# Patient Record
Sex: Female | Born: 1935 | Race: White | Hispanic: No | Smoking: Never smoker
Health system: Southern US, Community
[De-identification: ages and names within clinical notes are randomized; demographics above are authoritative.]

## PROBLEM LIST (undated history)

## (undated) DIAGNOSIS — Z952 Presence of prosthetic heart valve: Secondary | ICD-10-CM

## (undated) DIAGNOSIS — I4891 Unspecified atrial fibrillation: Secondary | ICD-10-CM

## (undated) DIAGNOSIS — M1990 Unspecified osteoarthritis, unspecified site: Secondary | ICD-10-CM

## (undated) DIAGNOSIS — I509 Heart failure, unspecified: Secondary | ICD-10-CM

## (undated) DIAGNOSIS — I00 Rheumatic fever without heart involvement: Secondary | ICD-10-CM

## (undated) DIAGNOSIS — I10 Essential (primary) hypertension: Secondary | ICD-10-CM

## (undated) DIAGNOSIS — E785 Hyperlipidemia, unspecified: Secondary | ICD-10-CM

## (undated) HISTORY — DX: Hyperlipidemia, unspecified: E78.5

## (undated) HISTORY — PX: AORTIC VALVE REPLACEMENT: SHX41

## (undated) HISTORY — DX: Heart failure, unspecified: I50.9

## (undated) HISTORY — DX: Presence of prosthetic heart valve: Z95.2

## (undated) HISTORY — PX: UPPER GI ENDOSCOPY: SHX6162

## (undated) HISTORY — DX: Unspecified atrial fibrillation: I48.91

## (undated) HISTORY — DX: Rheumatic fever without heart involvement: I00

## (undated) HISTORY — DX: Unspecified osteoarthritis, unspecified site: M19.90

## (undated) HISTORY — DX: Essential (primary) hypertension: I10

---

## 1989-10-22 ENCOUNTER — Encounter: Payer: Self-pay | Admitting: Cardiovascular Disease

## 2003-05-07 ENCOUNTER — Other Ambulatory Visit: Payer: Self-pay

## 2005-09-08 ENCOUNTER — Ambulatory Visit: Payer: Self-pay | Admitting: Family Medicine

## 2006-10-30 LAB — HM PAP SMEAR: HM PAP: NORMAL

## 2006-10-31 ENCOUNTER — Ambulatory Visit: Payer: Self-pay | Admitting: Family Medicine

## 2006-12-11 ENCOUNTER — Ambulatory Visit: Payer: Self-pay | Admitting: Family Medicine

## 2006-12-11 DIAGNOSIS — R072 Precordial pain: Secondary | ICD-10-CM | POA: Insufficient documentation

## 2006-12-12 ENCOUNTER — Encounter: Payer: Self-pay | Admitting: Cardiovascular Disease

## 2006-12-14 ENCOUNTER — Inpatient Hospital Stay: Payer: Self-pay | Admitting: Internal Medicine

## 2006-12-16 ENCOUNTER — Other Ambulatory Visit: Payer: Self-pay

## 2006-12-17 ENCOUNTER — Encounter: Payer: Self-pay | Admitting: Cardiovascular Disease

## 2007-11-19 ENCOUNTER — Ambulatory Visit: Payer: Self-pay | Admitting: Family Medicine

## 2008-01-09 ENCOUNTER — Ambulatory Visit: Payer: Self-pay | Admitting: Gastroenterology

## 2008-01-09 LAB — HM COLONOSCOPY

## 2009-01-15 ENCOUNTER — Encounter: Payer: Self-pay | Admitting: Cardiovascular Disease

## 2009-04-14 ENCOUNTER — Ambulatory Visit: Payer: Self-pay | Admitting: Family Medicine

## 2009-10-19 ENCOUNTER — Encounter: Payer: Self-pay | Admitting: Cardiovascular Disease

## 2010-01-14 ENCOUNTER — Ambulatory Visit: Payer: Self-pay | Admitting: Cardiovascular Disease

## 2010-01-14 DIAGNOSIS — E785 Hyperlipidemia, unspecified: Secondary | ICD-10-CM | POA: Insufficient documentation

## 2010-01-14 DIAGNOSIS — I4891 Unspecified atrial fibrillation: Secondary | ICD-10-CM

## 2010-01-14 DIAGNOSIS — I10 Essential (primary) hypertension: Secondary | ICD-10-CM

## 2010-01-14 DIAGNOSIS — Z9889 Other specified postprocedural states: Secondary | ICD-10-CM | POA: Insufficient documentation

## 2010-01-14 DIAGNOSIS — I2510 Atherosclerotic heart disease of native coronary artery without angina pectoris: Secondary | ICD-10-CM

## 2010-03-01 ENCOUNTER — Encounter: Payer: Self-pay | Admitting: Cardiovascular Disease

## 2010-03-01 ENCOUNTER — Ambulatory Visit: Payer: Self-pay

## 2010-03-29 ENCOUNTER — Ambulatory Visit: Payer: Self-pay | Admitting: Cardiovascular Disease

## 2010-03-30 LAB — CONVERTED CEMR LAB
BUN: 18 mg/dL (ref 6–23)
CO2: 24 meq/L (ref 19–32)
Chloride: 103 meq/L (ref 96–112)
Glucose, Bld: 113 mg/dL — ABNORMAL HIGH (ref 70–99)
Potassium: 4.7 meq/L (ref 3.5–5.3)

## 2010-04-28 ENCOUNTER — Ambulatory Visit: Payer: Self-pay | Admitting: Family Medicine

## 2010-05-25 NOTE — Letter (Signed)
Summary: [facility] - Cardiac Cath  [facility] - [facility] Cath   Imported By: Marylou Mccoy 02/15/2010 10:02:43  _____________________________________________________________________  External Attachment:    Type:   Image     Comment:   External Document

## 2010-05-25 NOTE — Assessment & Plan Note (Signed)
Summary: NP6/AMD   Visit Type:  Initial Consult Primary [name]:  Kathy Mccall, M.D.  CC:  Former [facility] patient need to establish care.  Denies chest pain or shortness of breath..  History of Present Illness: [name] is a very pleasant 77 year old woman with a history of rheumatic valve disease, St. Jude aortic valve replacement in 1991, chronic atrial fibrillation, moderate to severe tricuspid regurgitation on echocardiogram in 2008 with mild to moderate pulmonary hypertension, right ventricular systolic pressure estimated at 46, who presents to establish care.She was last seen at [facility] in August September 2010.  [name] reports that she is doing well. She has no symptoms of edema, chest discomfort. She did have an episode of lightheadedness some time ago and was seen by ear nose throat. No treatment was done other than clearing out of her wax buildup in her ears. She is active, walks with a 4 prong cane and otherwise has no complaints. Her last echo was in 2008.  EKG shows atrial fibrillation with rate of 68 beats per minute, no significant ST or T wave changes  Cardiac catheterization in August 2008 showed mild to moderate distal left main disease, moderate ostial left circumflex disease, mild ostial LAD and ostial B1 disease, severe disease of the PL range medical management was recommended.  Cholesterol in 2009 was 114, LDL 56, HDL 46 In the past, she has had difficulty tolerating Lipitor or Vytorin  Preventive Screening-Counseling & Management  Alcohol-Tobacco     Smoking Status: never  Caffeine-Diet-Exercise     Does Patient Exercise: yes  Current Medications (verified): 1)  Warfarin Sodium 3 Mg Tabs (Warfarin Sodium) .... As Directed 2)  Pravastatin Sodium 20 Mg Tabs (Pravastatin Sodium) .[name].. 1 Tab Once Daily 3)  Lisinopril 10 Mg Tabs (Lisinopril) .[name].. 1 Tab Once Daily 4)  Prilosec 20 Mg Cpdr (Omeprazole) .[name].. 1 Tab Once Daily 5)   Warfarin Sodium 2 Mg Tabs (Warfarin Sodium) .... Take As Directed 6)  Daily Multiple Vitamins  Tabs (Multiple Vitamin) .... One Tablet Once Daily 7)  Ferrex 150 150 Mg Caps (Polysaccharide Iron Complex) .... One Tablet Two Times A Day 8)  Xalatan 0.005 % Soln (Latanoprost) 9)  Hydrocodone-Acetaminophen 5-500 Mg Tabs (Hydrocodone-Acetaminophen) .... 1/2 Tablet Two Times A Day 10)  Oscal 500/200 D-3 500-200 Mg-Unit Tabs (Calcium-Vitamin D) .... One Tablet Once Daily 11)  Timoptic 0.5 % Soln (Timolol Maleate) .... Eye Drop  Allergies (verified): 1)  ! Sulfa  Past History:  Past Medical History: Arthritis Atrial Fibrillation Congestive Heart Failure St. Jude Aortic Valve replacement Hx. of rheumatic fever  Past Surgical History: St. Jude Aortic Valve replacement  Social History: Retired  Widowed  Tobacco Use - No.  Alcohol Use - no Regular Exercise - yes Smoking Status:  never Does Patient Exercise:  yes  Review of Systems  The patient denies fever, weight loss, weight gain, vision loss, decreased hearing, hoarseness, chest pain, syncope, dyspnea on exertion, peripheral edema, prolonged cough, abdominal pain, incontinence, muscle weakness, depression, and enlarged lymph nodes.    Vital Signs:  Patient profile:   77 year old female Height:      64 inches Weight:      113 pounds BMI:     19.47 Pulse rate:   66 / minute BP sitting:   162 / 82  (left arm) Cuff size:   regular  Vitals Entered By: Bishop Dublin, CMA (January 14, 2010 9:56 AM)  Physical Exam  General:  Well  developed, well nourished, in no acute distress. Head:  normocephalic and atraumatic Neck:  Neck supple, no JVD. No masses, thyromegaly or abnormal cervical nodes. Lungs:  Clear bilaterally to auscultation and percussion. Heart:  Non-displaced PMI, chest non-tender; regular rate and rhythm, S1, S2 with II/VI SEM RSB,  no  rubs or gallops. Carotid upstroke normal, no bruit. Pedals normal pulses. No  edema, no varicosities. Abdomen:   abdomen soft and non-tender without masses Msk:  Back normal, normal gait. Muscle strength and tone normal. Pulses:  pulses normal in all 4 extremities Extremities:  No clubbing or cyanosis. Neurologic:  Alert and oriented x 3. Skin:  Intact without lesions or rashes. Psych:  Normal affect.   Impression & Recommendations:  Problem # 1:  HEART VALVE REPLACEMENT, HX OF (ICD-V15.1) she has an aortic valve replacement 20 years ago with mechanical valve. She is due for an echocardiogram as the last echo was in 2008. she does not appear to have clinical indications of heart failure  Orders: EKG w/ Interpretation (93000) Echocardiogram (Echo)  Problem # 2:  CAD, NATIVE VESSEL (ICD-414.01) She does have coronary artery disease. Her cholesterol has always been at goal. No changes to her medications at this time  Her updated medication list for this problem includes:    Warfarin Sodium 3 Mg Tabs (Warfarin sodium) .[name]... As directed    Lisinopril 10 Mg Tabs (Lisinopril) .[name]... 1 tab once daily    Warfarin Sodium 2 Mg Tabs (Warfarin sodium) .[name]... Take as directed  Problem # 3:  HYPERLIPIDEMIA-MIXED (ICD-272.4) We will try to obtain her most recent lipid panel from Dr. Sullivan Lone. Cholesterol in 2009 was 114, LDL 56, HDL 46  Her updated medication list for this problem includes:    Pravastatin Sodium 20 Mg Tabs (Pravastatin sodium) .[name]... 1 tab once daily  Problem # 4:  ATRIAL FIBRILLATION (ICD-427.31) rate is well controlled on no beta blockers or calcium channel blockers.  Her updated medication list for this problem includes:    Warfarin Sodium 3 Mg Tabs (Warfarin sodium) .[name]... As directed    Warfarin Sodium 2 Mg Tabs (Warfarin sodium) .[name]... Take as directed  Problem # 5:  HYPERTENSION, BENIGN (ICD-401.1) Her blood pressure is borderline elevated today though she reports it is well controlled at home. Have asked her to monitor her blood pressure at home  and call us for systolic pressures greater than 150  Her updated medication list for this problem includes:    Lisinopril 10 Mg Tabs (Lisinopril) .[name]... 1 tab once daily  Patient Instructions: 1)  Your physician recommends that you continue on your current medications as directed. Please refer to the Current Medication list given to you today. 2)  Your physician wants you to follow-up in:   1 year You will receive a reminder letter in the mail two months in advance. If you don't receive a letter, please call our office to schedule the follow-up appointment. 3)  Your physician has requested that you have an echocardiogram.  Echocardiography is a painless test that uses sound waves to create images of your heart. It provides your doctor with information about the size and shape of your heart and how well your heart's chambers and valves are working.  This procedure takes approximately one hour. There are no restrictions for this procedure. NOVEMBER 2011

## 2010-05-25 NOTE — Letter (Signed)
Summary: External Correspondence  External Correspondence   Imported By: [place] Carbo 10/19/2009 15:14:56  _____________________________________________________________________  External Attachment:    Type:   Image     Comment:   External Document

## 2010-05-25 NOTE — Letter (Signed)
Summary: [facility] - 2-D Echo  [facility] - 2-D Echo   Imported By: Marylou Mccoy 02/15/2010 10:04:33  _____________________________________________________________________  External Attachment:    Type:   Image     Comment:   External Document

## 2010-05-25 NOTE — Letter (Signed)
Summary: [facility] - Cardiac Cath Comprehensive Report  [facility] - Cardiac Cath Comprehensive Report   Imported By: Marylou Mccoy 02/15/2010 10:09:04  _____________________________________________________________________  External Attachment:    Type:   Image     Comment:   External Document

## 2010-06-02 ENCOUNTER — Encounter: Payer: Self-pay | Admitting: Cardiovascular Disease

## 2010-10-04 ENCOUNTER — Other Ambulatory Visit: Payer: Self-pay | Admitting: Cardiovascular Disease

## 2010-11-21 ENCOUNTER — Encounter: Payer: Self-pay | Admitting: Cardiovascular Disease

## 2010-12-29 ENCOUNTER — Encounter: Payer: Self-pay | Admitting: Cardiovascular Disease

## 2011-01-02 ENCOUNTER — Ambulatory Visit (INDEPENDENT_AMBULATORY_CARE_PROVIDER_SITE_OTHER): Payer: Medicare Other | Admitting: Cardiovascular Disease

## 2011-01-02 ENCOUNTER — Encounter: Payer: Self-pay | Admitting: Cardiovascular Disease

## 2011-01-02 VITALS — BP 159/77 | HR 78 | Ht 63.0 in | Wt 108.0 lb

## 2011-01-02 DIAGNOSIS — Z9889 Other specified postprocedural states: Secondary | ICD-10-CM

## 2011-01-02 DIAGNOSIS — I2510 Atherosclerotic heart disease of native coronary artery without angina pectoris: Secondary | ICD-10-CM

## 2011-01-02 DIAGNOSIS — I359 Nonrheumatic aortic valve disorder, unspecified: Secondary | ICD-10-CM

## 2011-01-02 DIAGNOSIS — E785 Hyperlipidemia, unspecified: Secondary | ICD-10-CM

## 2011-01-02 DIAGNOSIS — I4891 Unspecified atrial fibrillation: Secondary | ICD-10-CM

## 2011-01-02 DIAGNOSIS — I10 Essential (primary) hypertension: Secondary | ICD-10-CM

## 2011-01-02 DIAGNOSIS — Z952 Presence of prosthetic heart valve: Secondary | ICD-10-CM

## 2011-01-02 NOTE — Assessment & Plan Note (Signed)
Kathy Mccall appears well controlled, she is on warfarin. No medication changes made.

## 2011-01-02 NOTE — Progress Notes (Signed)
Patient ID: Kathy Mccall, female    DOB: June 23, 1923, 77 y.o.   MRN: [id number]  HPI Comments: Kathy Mccall is a very pleasant 77 year old woman with a history of rheumatic valve disease, St. Jude aortic valve replacement in 1991, chronic atrial fibrillation, moderate to severe tricuspid regurgitation on echocardiogram in 2008 with mild to moderate pulmonary hypertension, right ventricular systolic pressure estimated at 46, who presents for routine followup.    Kathy Mccall reports that she is doing well. She has no symptoms of edema, chest discomfort. No lightheadedness. She is active, walks with a 4 prong cane and otherwise has no complaints. Her last echo was in 2008.   EKG shows atrial fibrillation with rate of 68 beats per minute, no significant ST or T wave changes   Cardiac catheterization in August 2008 showed mild to moderate distal left main disease, moderate ostial left circumflex disease, mild ostial LAD and ostial B1 disease, severe disease of the PL range medical management was recommended.   Cholesterol in 2009 was 114, LDL 56, HDL 46 In the past, she has had difficulty tolerating Lipitor, Now on low-dose pravastatin  EKG shows atrial fibrillation with rate 65 beats per minute with no significant ST or T wave changes, poor R wave progression through the anterior precordial leads      Outpatient Encounter Prescriptions as of 01/02/2011  Medication Sig Dispense Refill  . calcium-vitamin D (OSCAL 500/200 D-3) 500-200 MG-UNIT per tablet Take 1 tablet by mouth daily.        [name] DAILY MULTIPLE VITAMINS tablet Take 1 tablet by mouth daily.        . furosemide (LASIX) 20 MG tablet TAKE ONE TABLET BY MOUTH EVERY DAY  30 tablet  6  . HYDROcodone-acetaminophen (VICODIN) 5-500 MG per tablet Take 0.5 tablets by mouth 2 (two) times daily.        . iron polysaccharides (FERREX 150) 150 MG capsule Take 150 mg by mouth 2 (two) times daily.        [name] KLOR-CON M20 20 MEQ tablet TAKE ONE TABLET  BY MOUTH EVERY DAY  30 each  6  . latanoprost (XALATAN) 0.005 % ophthalmic solution 1 drop at bedtime.        [name] levothyroxine (SYNTHROID, LEVOTHROID) 50 MCG tablet Take 50 mcg by mouth daily.        [name] lisinopril (PRINIVIL,ZESTRIL) 10 MG tablet Take 10 mg by mouth daily.        [name] omeprazole (PRILOSEC) 20 MG capsule Take 20 mg by mouth daily.        . pravastatin (PRAVACHOL) 20 MG tablet Take 20 mg by mouth daily.        . timolol (TIMOPTIC) 0.5 % ophthalmic solution 1 drop.        [name] warfarin (COUMADIN) 2 MG tablet Take 2 mg by mouth as directed.        . warfarin (COUMADIN) 3 MG tablet Take 3 mg by mouth as directed.           Review of Systems  Constitutional: Negative.   HENT: Negative.   Eyes: Negative.   Respiratory: Negative.   Cardiovascular: Negative.   Gastrointestinal: Negative.   Musculoskeletal: Negative.   Skin: Negative.   Neurological: Negative.   Hematological: Negative.   Psychiatric/Behavioral: Negative.   All other systems reviewed and are negative.    BP 159/77  Pulse 78  Ht 5\' 3"  (1.6 m)  Wt 108 lb (48.988 kg)  BMI 19.13 kg/m2  Physical Exam  Nursing note and vitals reviewed. Constitutional: She is oriented to person, place, and time. She appears well-developed and well-nourished.  HENT:  Head: Normocephalic.  Nose: Nose normal.  Mouth/Throat: Oropharynx is clear and moist.  Eyes: Conjunctivae are normal. Pupils are equal, round, and reactive to light.  Neck: Normal range of motion. Neck supple. No JVD present.  Cardiovascular: Normal rate, regular rhythm, S1 normal, S2 normal and intact distal pulses.  Exam reveals no gallop and no friction rub.   Murmur heard.  Crescendo systolic murmur is present with a grade of 2/6  Pulmonary/Chest: Effort normal and breath sounds normal. No respiratory distress. She has no wheezes. She has no rales. She exhibits no tenderness.  Abdominal: Soft. Bowel sounds are normal. She exhibits no distension. There is no  tenderness.  Musculoskeletal: Normal range of motion. She exhibits no edema and no tenderness.  Lymphadenopathy:    She has no cervical adenopathy.  Neurological: She is alert and oriented to person, place, and time. Coordination normal.  Skin: Skin is warm and dry. No rash noted. No erythema.  Psychiatric: She has a normal mood and affect. Her behavior is normal. Judgment and thought content normal.         Assessment and Plan

## 2011-01-02 NOTE — Assessment & Plan Note (Signed)
Currently with no symptoms of angina. No further workup at this time. Continue current medication regimen. 

## 2011-01-02 NOTE — Assessment & Plan Note (Signed)
We will schedule her for an echocardiogram at the end of the year to evaluate her prosthetic aortic valve.  Continue low-dose diuretic. Last echo showed pulmonary hypertension.

## 2011-01-02 NOTE — Patient Instructions (Addendum)
You are doing well. No medication changes were made. Please monitro the blood pressure at home and call for systolic pressure greater than 140 Please call us if you have new issues that need to be addressed before your next appt.  We will call you for a follow up Appt. In 12 months Schedule an Echo for November, 2012.

## 2011-01-02 NOTE — Assessment & Plan Note (Signed)
I'm concerned that her cholesterol may not be at goal on pravastatin. Will wait for her followup with Dr. Sullivan Lone  later this fall and try to obtain her numbers at that time.

## 2011-01-02 NOTE — Assessment & Plan Note (Signed)
Blood pressure is elevated on today's visit. I suggested she monitor her blood pressure at home and contact us for systolic pressures greater than 140.

## 2011-03-07 ENCOUNTER — Other Ambulatory Visit (INDEPENDENT_AMBULATORY_CARE_PROVIDER_SITE_OTHER): Payer: Medicare Other | Admitting: *Deleted

## 2011-03-07 DIAGNOSIS — Z952 Presence of prosthetic heart valve: Secondary | ICD-10-CM

## 2011-03-07 DIAGNOSIS — I059 Rheumatic mitral valve disease, unspecified: Secondary | ICD-10-CM

## 2011-03-07 DIAGNOSIS — I359 Nonrheumatic aortic valve disorder, unspecified: Secondary | ICD-10-CM

## 2011-03-19 ENCOUNTER — Encounter: Payer: Self-pay | Admitting: Cardiovascular Disease

## 2011-03-20 LAB — PROTIME-INR: INR: 2 — AB (ref 0.9–1.1)

## 2011-05-04 ENCOUNTER — Telehealth: Payer: Self-pay

## 2011-05-04 MED ORDER — FUROSEMIDE 20 MG PO TABS: 20.0000 mg | ORAL_TABLET | Freq: Every day | ORAL | Status: DC

## 2011-05-04 NOTE — Telephone Encounter (Signed)
Refill sent for lasix  

## 2011-05-29 ENCOUNTER — Other Ambulatory Visit: Payer: Self-pay | Admitting: Cardiovascular Disease

## 2011-11-28 ENCOUNTER — Other Ambulatory Visit: Payer: Self-pay | Admitting: Cardiovascular Disease

## 2012-02-29 LAB — LAB REPORT - SCANNED: INR: 2.2

## 2012-03-07 ENCOUNTER — Encounter: Payer: Self-pay | Admitting: Cardiovascular Disease

## 2012-03-07 ENCOUNTER — Ambulatory Visit (INDEPENDENT_AMBULATORY_CARE_PROVIDER_SITE_OTHER): Payer: Medicare Other | Admitting: Cardiovascular Disease

## 2012-03-07 VITALS — BP 134/74 | HR 61 | Ht 63.0 in | Wt 113.5 lb

## 2012-03-07 DIAGNOSIS — Z9889 Other specified postprocedural states: Secondary | ICD-10-CM

## 2012-03-07 DIAGNOSIS — I10 Essential (primary) hypertension: Secondary | ICD-10-CM

## 2012-03-07 DIAGNOSIS — I4891 Unspecified atrial fibrillation: Secondary | ICD-10-CM

## 2012-03-07 DIAGNOSIS — E785 Hyperlipidemia, unspecified: Secondary | ICD-10-CM

## 2012-03-07 NOTE — Patient Instructions (Addendum)
You are doing well. No medication changes were made.  We will for you for an echo in 6 months, for AVR (aortic valve)  Please call us if you have new issues that need to be addressed before your next appt.  Your physician wants you to follow-up in: 12 months.  You will receive a reminder letter in the mail two months in advance. If you don't receive a letter, please call our office to schedule the follow-up appointment.

## 2012-03-07 NOTE — Addendum Note (Signed)
Addended by: Sabino Snipes E on: 03/07/2012 03:09 PM   Modules accepted: Orders

## 2012-03-07 NOTE — Assessment & Plan Note (Signed)
Rate is well controlled. No changes to her medications.   

## 2012-03-07 NOTE — Progress Notes (Signed)
Patient ID: Kathy Mccall, female    DOB: 1923-09-02, 78 y.o.   MRN: [id number]  HPI Comments: Kathy Mccall is a very pleasant 78 year old woman with a history of rheumatic valve disease, St. Jude aortic valve replacement in 1991, chronic atrial fibrillation on warfarin, moderate to severe tricuspid regurgitation on echocardiogram in 2008 with mild to moderate pulmonary hypertension, right ventricular systolic pressure estimated at 46, who presents for routine followup.    Kathy Mccall reports that she is doing well. She has no symptoms of edema, chest discomfort. No lightheadedness. She is active, walks with a 4 prong cane and otherwise has no complaints. Her last echo was in 2008. She has had a several pound weight gain since her last clinic visit. Some gait instability. No falls.   Cardiac catheterization in August 2008 showed mild to moderate distal left main disease, moderate ostial left circumflex disease, mild ostial LAD and ostial B1 disease, severe disease of the PL range medical management was recommended.   In the past, she has had difficulty tolerating Lipitor, Now on low-dose pravastatin  EKG shows atrial fibrillation with rate of 61 beats per minute, no significant ST or T wave changes        Outpatient Encounter Prescriptions as of 03/07/2012  Medication Sig Dispense Refill  . calcium-vitamin D (OSCAL 500/200 D-3) 500-200 MG-UNIT per tablet Take 1 tablet by mouth daily.        [name] DAILY MULTIPLE VITAMINS tablet Take 1 tablet by mouth daily.        . furosemide (LASIX) 20 MG tablet TAKE ONE TABLET BY MOUTH EVERY DAY  30 tablet  5  . HYDROcodone-acetaminophen (VICODIN) 5-500 MG per tablet Take 0.5 tablets by mouth 2 (two) times daily.        . iron polysaccharides (FERREX 150) 150 MG capsule Take 150 mg by mouth 2 (two) times daily.        [name] KLOR-CON M20 20 MEQ tablet TAKE ONE TABLET BY MOUTH EVERY DAY  30 each  11  . latanoprost (XALATAN) 0.005 % ophthalmic solution 1 drop at  bedtime.        [name] levothyroxine (SYNTHROID, LEVOTHROID) 50 MCG tablet Take 50 mcg by mouth daily.        [name] lisinopril (PRINIVIL,ZESTRIL) 10 MG tablet Take 10 mg by mouth daily.        [name] omeprazole (PRILOSEC) 20 MG capsule Take 20 mg by mouth daily.        . pravastatin (PRAVACHOL) 20 MG tablet Take 20 mg by mouth daily.        . timolol (TIMOPTIC) 0.5 % ophthalmic solution 1 drop.        [name] warfarin (COUMADIN) 2 MG tablet Take 2 mg by mouth as directed.        . warfarin (COUMADIN) 3 MG tablet Take 3 mg by mouth as directed.           Review of Systems  Constitutional: Negative.   HENT: Negative.   Eyes: Negative.   Respiratory: Negative.   Cardiovascular: Negative.   Gastrointestinal: Negative.   Musculoskeletal: Positive for gait problem.  Skin: Negative.   Neurological: Negative.   Hematological: Negative.   Psychiatric/Behavioral: Negative.   All other systems reviewed and are negative.    BP 134/74  Pulse 61  Ht 5\' 3"  (1.6 m)  Wt 113 lb 8 oz (51.483 kg)  BMI 20.11 kg/m2  Physical Exam  Nursing note and vitals reviewed. Constitutional: She is  oriented to person, place, and time. She appears well-developed and well-nourished.  HENT:  Head: Normocephalic.  Nose: Nose normal.  Mouth/Throat: Oropharynx is clear and moist.  Eyes: Conjunctivae normal are normal. Pupils are equal, round, and reactive to light.  Neck: Normal range of motion. Neck supple. No JVD present.  Cardiovascular: Normal rate, S1 normal, S2 normal and intact distal pulses.  An irregularly irregular rhythm present. Exam reveals no gallop and no friction rub.   Murmur heard.  Crescendo systolic murmur is present with a grade of 2/6  Pulmonary/Chest: Effort normal and breath sounds normal. No respiratory distress. She has no wheezes. She has no rales. She exhibits no tenderness.  Abdominal: Soft. Bowel sounds are normal. She exhibits no distension. There is no tenderness.  Musculoskeletal: Normal range of  motion. She exhibits no edema and no tenderness.  Lymphadenopathy:    She has no cervical adenopathy.  Neurological: She is alert and oriented to person, place, and time. Coordination normal.  Skin: Skin is warm and dry. No rash noted. No erythema.  Psychiatric: She has a normal mood and affect. Her behavior is normal. Judgment and thought content normal.         Assessment and Plan

## 2012-03-07 NOTE — Assessment & Plan Note (Signed)
Blood pressure is well controlled on today's visit. No changes made to the medications. 

## 2012-03-07 NOTE — Assessment & Plan Note (Signed)
We will recheck her valve by echocardiogram. She prefers to wait several months. We'll do this in 6 months time.

## 2012-03-07 NOTE — Assessment & Plan Note (Signed)
No changes to the medications were made. She has lab work done by Dr. Sullivan Lone.

## 2012-04-15 ENCOUNTER — Telehealth: Payer: Self-pay | Admitting: Cardiovascular Disease

## 2012-04-15 NOTE — Telephone Encounter (Signed)
Patient had to come off coumadin due to nose bleed has some questions regarding the dosing primary care office told her to take.  Please Call jane spoon patient's daughter to discuss.

## 2012-04-15 NOTE — Telephone Encounter (Signed)
Returned call to pt's daughter, Dr Sullivan Lone primary MD monitors pt's Coumadin. He instructed pt to hold Coumadin x 3 days secondary to nosebleed, INR 1.3 gave 2mg  x 3 days INR 1.0.  Previous dosage was 3mg  daily INR was around 3.0 at time of nosebleed.  Nose packing removed by ENT advised 7-10 days before completely healed.  Dr Sullivan Lone advised pt if nose well, take 6mg  x 3 days, if not well take 2mg  x 3 days then 3mg  daily, pt's daughter unsure what to do unhappy with Dr Elisabeth Cara instructions and wishes to est pt in Coumadin clinic for monitoring.  Advised to have pt resume 3mg  daily today and made new Coumadin appt in clinic for next week 04/26/11.  Advised to continue to monitor for bleeding and call with questions or problems.  Pt's daughter verbalized understanding.

## 2012-04-23 LAB — LAB REPORT - SCANNED: INR: 1.5

## 2012-04-25 ENCOUNTER — Ambulatory Visit (INDEPENDENT_AMBULATORY_CARE_PROVIDER_SITE_OTHER): Payer: Medicare Other

## 2012-04-25 DIAGNOSIS — Z7901 Long term (current) use of anticoagulants: Secondary | ICD-10-CM | POA: Insufficient documentation

## 2012-04-25 DIAGNOSIS — I4891 Unspecified atrial fibrillation: Secondary | ICD-10-CM

## 2012-04-25 DIAGNOSIS — Z9889 Other specified postprocedural states: Secondary | ICD-10-CM

## 2012-04-25 LAB — POCT INR: INR: 2.1

## 2012-05-01 ENCOUNTER — Ambulatory Visit (INDEPENDENT_AMBULATORY_CARE_PROVIDER_SITE_OTHER): Payer: Medicare Other

## 2012-05-01 DIAGNOSIS — I4891 Unspecified atrial fibrillation: Secondary | ICD-10-CM

## 2012-05-01 DIAGNOSIS — Z9889 Other specified postprocedural states: Secondary | ICD-10-CM

## 2012-05-01 DIAGNOSIS — Z7901 Long term (current) use of anticoagulants: Secondary | ICD-10-CM

## 2012-05-01 LAB — POCT INR: INR: 2.4

## 2012-05-28 ENCOUNTER — Other Ambulatory Visit: Payer: Self-pay | Admitting: Cardiovascular Disease

## 2012-05-28 NOTE — Telephone Encounter (Signed)
Refilled Furosemide sent to [facility].

## 2012-05-29 ENCOUNTER — Ambulatory Visit (INDEPENDENT_AMBULATORY_CARE_PROVIDER_SITE_OTHER): Payer: Medicare Other

## 2012-05-29 DIAGNOSIS — Z7901 Long term (current) use of anticoagulants: Secondary | ICD-10-CM

## 2012-05-29 DIAGNOSIS — Z9889 Other specified postprocedural states: Secondary | ICD-10-CM

## 2012-05-29 DIAGNOSIS — I4891 Unspecified atrial fibrillation: Secondary | ICD-10-CM

## 2012-05-29 LAB — POCT INR: INR: 4.3

## 2012-06-12 ENCOUNTER — Ambulatory Visit (INDEPENDENT_AMBULATORY_CARE_PROVIDER_SITE_OTHER): Payer: Medicare Other

## 2012-06-12 DIAGNOSIS — Z9889 Other specified postprocedural states: Secondary | ICD-10-CM

## 2012-06-12 LAB — POCT INR: INR: 2.8

## 2012-07-03 ENCOUNTER — Ambulatory Visit (INDEPENDENT_AMBULATORY_CARE_PROVIDER_SITE_OTHER): Payer: Medicare Other

## 2012-07-03 DIAGNOSIS — Z9889 Other specified postprocedural states: Secondary | ICD-10-CM

## 2012-07-03 LAB — POCT INR: INR: 2.9

## 2012-07-04 ENCOUNTER — Other Ambulatory Visit: Payer: Self-pay | Admitting: Cardiovascular Disease

## 2012-07-04 NOTE — Telephone Encounter (Signed)
Refilled Klor Con sent to [facility] pharmacy.

## 2012-07-31 ENCOUNTER — Ambulatory Visit (INDEPENDENT_AMBULATORY_CARE_PROVIDER_SITE_OTHER): Payer: Medicare Other

## 2012-07-31 DIAGNOSIS — Z9889 Other specified postprocedural states: Secondary | ICD-10-CM

## 2012-07-31 DIAGNOSIS — I4891 Unspecified atrial fibrillation: Secondary | ICD-10-CM

## 2012-07-31 DIAGNOSIS — Z7901 Long term (current) use of anticoagulants: Secondary | ICD-10-CM

## 2012-08-28 ENCOUNTER — Ambulatory Visit: Payer: Self-pay | Admitting: Family Medicine

## 2012-09-03 ENCOUNTER — Other Ambulatory Visit: Payer: Self-pay

## 2012-09-03 ENCOUNTER — Other Ambulatory Visit (INDEPENDENT_AMBULATORY_CARE_PROVIDER_SITE_OTHER): Payer: Medicare Other

## 2012-09-03 DIAGNOSIS — I4891 Unspecified atrial fibrillation: Secondary | ICD-10-CM

## 2012-09-03 DIAGNOSIS — I359 Nonrheumatic aortic valve disorder, unspecified: Secondary | ICD-10-CM

## 2012-09-03 DIAGNOSIS — Z9889 Other specified postprocedural states: Secondary | ICD-10-CM

## 2012-09-03 DIAGNOSIS — I10 Essential (primary) hypertension: Secondary | ICD-10-CM

## 2012-09-03 DIAGNOSIS — E785 Hyperlipidemia, unspecified: Secondary | ICD-10-CM

## 2012-09-10 ENCOUNTER — Ambulatory Visit: Payer: Self-pay | Admitting: Family Medicine

## 2012-09-11 ENCOUNTER — Ambulatory Visit (INDEPENDENT_AMBULATORY_CARE_PROVIDER_SITE_OTHER): Payer: Medicare Other

## 2012-09-11 DIAGNOSIS — I4891 Unspecified atrial fibrillation: Secondary | ICD-10-CM

## 2012-09-11 DIAGNOSIS — Z7901 Long term (current) use of anticoagulants: Secondary | ICD-10-CM

## 2012-09-11 DIAGNOSIS — Z9889 Other specified postprocedural states: Secondary | ICD-10-CM

## 2012-10-23 ENCOUNTER — Ambulatory Visit (INDEPENDENT_AMBULATORY_CARE_PROVIDER_SITE_OTHER): Payer: Medicare Other | Admitting: *Deleted

## 2012-10-23 DIAGNOSIS — Z7901 Long term (current) use of anticoagulants: Secondary | ICD-10-CM

## 2012-10-23 DIAGNOSIS — I4891 Unspecified atrial fibrillation: Secondary | ICD-10-CM

## 2012-10-23 DIAGNOSIS — Z9889 Other specified postprocedural states: Secondary | ICD-10-CM

## 2012-10-23 LAB — POCT INR: INR: 2.9

## 2012-10-24 ENCOUNTER — Other Ambulatory Visit: Payer: Self-pay | Admitting: Cardiovascular Disease

## 2012-11-25 ENCOUNTER — Other Ambulatory Visit: Payer: Self-pay | Admitting: Cardiovascular Disease

## 2012-11-25 NOTE — Telephone Encounter (Signed)
Refilled Klor-Con 

## 2012-12-04 ENCOUNTER — Ambulatory Visit (INDEPENDENT_AMBULATORY_CARE_PROVIDER_SITE_OTHER): Payer: Medicare Other

## 2012-12-04 DIAGNOSIS — Z7901 Long term (current) use of anticoagulants: Secondary | ICD-10-CM

## 2012-12-04 DIAGNOSIS — I4891 Unspecified atrial fibrillation: Secondary | ICD-10-CM

## 2012-12-04 DIAGNOSIS — Z9889 Other specified postprocedural states: Secondary | ICD-10-CM

## 2013-01-15 ENCOUNTER — Ambulatory Visit (INDEPENDENT_AMBULATORY_CARE_PROVIDER_SITE_OTHER): Payer: Medicare Other

## 2013-01-15 DIAGNOSIS — I4891 Unspecified atrial fibrillation: Secondary | ICD-10-CM

## 2013-01-15 DIAGNOSIS — Z9889 Other specified postprocedural states: Secondary | ICD-10-CM

## 2013-01-15 DIAGNOSIS — Z7901 Long term (current) use of anticoagulants: Secondary | ICD-10-CM

## 2013-02-12 ENCOUNTER — Ambulatory Visit (INDEPENDENT_AMBULATORY_CARE_PROVIDER_SITE_OTHER): Payer: Medicare Other | Admitting: General Practice

## 2013-02-12 DIAGNOSIS — Z7901 Long term (current) use of anticoagulants: Secondary | ICD-10-CM

## 2013-02-12 DIAGNOSIS — I4891 Unspecified atrial fibrillation: Secondary | ICD-10-CM

## 2013-02-12 DIAGNOSIS — Z9889 Other specified postprocedural states: Secondary | ICD-10-CM

## 2013-02-12 LAB — POCT INR: INR: 2.5

## 2013-03-07 ENCOUNTER — Ambulatory Visit (INDEPENDENT_AMBULATORY_CARE_PROVIDER_SITE_OTHER): Payer: Medicare Other | Admitting: *Deleted

## 2013-03-07 ENCOUNTER — Encounter: Payer: Medicare Other | Admitting: *Deleted

## 2013-03-07 ENCOUNTER — Ambulatory Visit (INDEPENDENT_AMBULATORY_CARE_PROVIDER_SITE_OTHER): Payer: Medicare Other | Admitting: Cardiovascular Disease

## 2013-03-07 ENCOUNTER — Encounter: Payer: Self-pay | Admitting: Cardiovascular Disease

## 2013-03-07 VITALS — BP 142/84 | HR 61 | Ht 63.0 in | Wt 114.0 lb

## 2013-03-07 DIAGNOSIS — Z7901 Long term (current) use of anticoagulants: Secondary | ICD-10-CM

## 2013-03-07 DIAGNOSIS — Z9889 Other specified postprocedural states: Secondary | ICD-10-CM

## 2013-03-07 DIAGNOSIS — I4891 Unspecified atrial fibrillation: Secondary | ICD-10-CM

## 2013-03-07 DIAGNOSIS — E785 Hyperlipidemia, unspecified: Secondary | ICD-10-CM

## 2013-03-07 DIAGNOSIS — I10 Essential (primary) hypertension: Secondary | ICD-10-CM

## 2013-03-07 DIAGNOSIS — I2510 Atherosclerotic heart disease of native coronary artery without angina pectoris: Secondary | ICD-10-CM

## 2013-03-07 NOTE — Assessment & Plan Note (Signed)
Blood pressure is well controlled on today's visit. No changes made to the medications. 

## 2013-03-07 NOTE — Assessment & Plan Note (Signed)
Recent echocardiogram early 2014 shows valve is well seated. No significant AS or AI

## 2013-03-07 NOTE — Assessment & Plan Note (Signed)
Rate is well controlled. No changes to her medications.

## 2013-03-07 NOTE — Assessment & Plan Note (Signed)
Cholesterol is at goal on the current lipid regimen. No changes to the medications were made.  

## 2013-03-07 NOTE — Patient Instructions (Signed)
You are doing well. No medication changes were made.  Please call us if you have new issues that need to be addressed before your next appt.  Your physician wants you to follow-up in: 12 months.  You will receive a reminder letter in the mail two months in advance. If you don't receive a letter, please call our office to schedule the follow-up appointment. 

## 2013-03-07 NOTE — Assessment & Plan Note (Signed)
Currently with no symptoms of angina. No further workup at this time. Continue current medication regimen. 

## 2013-03-07 NOTE — Progress Notes (Signed)
Patient ID: Kathy Mccall, female    DOB: [birth year], 79 y.o.   MRN: [id number]  HPI Comments: Kathy Mccall is a very pleasant 79 year old woman with a history of rheumatic valve disease, St. Jude aortic valve replacement in 1991, chronic atrial fibrillation on warfarin,  severe tricuspid regurgitation on echocardiogram dating back to 2008, previous history of mild to moderate pulmonary hypertension (right ventricular systolic pressure estimated at 46), who presents for routine followup. She has been stable on Lasix daily.   Kathy Mccall reports that she is doing well. She has no symptoms of edema, chest discomfort. No lightheadedness. She is active, walks with a 4 prong cane and otherwise has no complaints. Her last echo was in early 2014 . Normal ejection fraction, severe TR, well-seated aortic valve . Poor appetite.  Some gait instability. No falls. No complications on warfarin   Cardiac catheterization in August 2008 showed mild to moderate distal left main disease, moderate ostial left circumflex disease, mild ostial LAD and ostial B1 disease, severe disease of the PL range medical management was recommended.   In the past, she has had difficulty tolerating Lipitor, Now on low-dose pravastatin Total cholesterol 166, LDL 85, HDL 65  EKG shows atrial fibrillation with rate of 61 beats per minute, no significant ST or T wave changes        Outpatient Encounter Prescriptions as of 03/07/2013  Medication Sig  . calcium-vitamin D (OSCAL 500/200 D-3) 500-200 MG-UNIT per tablet Take 1 tablet by mouth daily.    [name] DAILY MULTIPLE VITAMINS tablet Take 1 tablet by mouth daily.    . furosemide (LASIX) 20 MG tablet TAKE ONE TABLET BY MOUTH EVERY DAY  . HYDROcodone-acetaminophen (VICODIN) 5-500 MG per tablet Take 0.5 tablets by mouth 2 (two) times daily.    . iron polysaccharides (FERREX 150) 150 MG capsule Take 150 mg by mouth 2 (two) times daily.    [name] KLOR-CON M20 20 MEQ tablet TAKE ONE TABLET  BY MOUTH ONCE DAILY  . latanoprost (XALATAN) 0.005 % ophthalmic solution 1 drop at bedtime.    [name] levothyroxine (SYNTHROID, LEVOTHROID) 50 MCG tablet Take 50 mcg by mouth daily.    [name] lisinopril (PRINIVIL,ZESTRIL) 10 MG tablet Take 10 mg by mouth daily.    [name] omeprazole (PRILOSEC) 20 MG capsule Take 20 mg by mouth daily.    . pravastatin (PRAVACHOL) 20 MG tablet Take 20 mg by mouth daily.    . timolol (TIMOPTIC) 0.5 % ophthalmic solution 1 drop.    [name] warfarin (COUMADIN) 3 MG tablet Take 3 mg by mouth as directed.    . [DISCONTINUED] warfarin (COUMADIN) 2 MG tablet Take 2 mg by mouth as directed.       Review of Systems  Constitutional: Negative.   HENT: Negative.   Eyes: Negative.   Respiratory: Negative.   Cardiovascular: Negative.   Gastrointestinal: Negative.   Endocrine: Negative.   Musculoskeletal: Positive for gait problem.  Skin: Negative.   Allergic/Immunologic: Negative.   Neurological: Negative.   Hematological: Negative.   Psychiatric/Behavioral: Negative.   All other systems reviewed and are negative.    BP 142/84  Pulse 61  Ht 5\' 3"  (1.6 m)  Wt 114 lb (51.71 kg)  BMI 20.20 kg/m2  Physical Exam  Nursing note and vitals reviewed. Constitutional: She is oriented to person, place, and time. She appears well-developed and well-nourished.  HENT:  Head: Normocephalic.  Nose: Nose normal.  Mouth/Throat: Oropharynx is clear and moist.  Eyes: Conjunctivae are normal.  Pupils are equal, round, and reactive to light.  Neck: Normal range of motion. Neck supple. No JVD present.  Cardiovascular: Normal rate, S1 normal, S2 normal and intact distal pulses.  An irregularly irregular rhythm present. Exam reveals no gallop and no friction rub.   Murmur heard.  Crescendo systolic murmur is present with a grade of 2/6  Pulmonary/Chest: Effort normal and breath sounds normal. No respiratory distress. She has no wheezes. She has no rales. She exhibits no tenderness.  Abdominal:  Soft. Bowel sounds are normal. She exhibits no distension. There is no tenderness.  Musculoskeletal: Normal range of motion. She exhibits no edema and no tenderness.  Lymphadenopathy:    She has no cervical adenopathy.  Neurological: She is alert and oriented to person, place, and time. Coordination normal.  Skin: Skin is warm and dry. No rash noted. No erythema.  Psychiatric: She has a normal mood and affect. Her behavior is normal. Judgment and thought content normal.    Assessment and Plan       And a okay if I may

## 2013-03-17 ENCOUNTER — Encounter: Payer: Self-pay | Admitting: *Deleted

## 2013-04-02 ENCOUNTER — Ambulatory Visit (INDEPENDENT_AMBULATORY_CARE_PROVIDER_SITE_OTHER): Payer: Medicare Other | Admitting: General Practice

## 2013-04-02 DIAGNOSIS — Z9889 Other specified postprocedural states: Secondary | ICD-10-CM

## 2013-04-02 DIAGNOSIS — I4891 Unspecified atrial fibrillation: Secondary | ICD-10-CM

## 2013-04-02 DIAGNOSIS — Z7901 Long term (current) use of anticoagulants: Secondary | ICD-10-CM

## 2013-04-02 LAB — POCT INR: INR: 2.8

## 2013-04-03 ENCOUNTER — Other Ambulatory Visit: Payer: Self-pay | Admitting: Cardiovascular Disease

## 2013-04-03 ENCOUNTER — Other Ambulatory Visit: Payer: Self-pay | Admitting: *Deleted

## 2013-04-03 MED ORDER — POTASSIUM CHLORIDE CRYS ER 20 MEQ PO TBCR: EXTENDED_RELEASE_TABLET | ORAL | Status: DC

## 2013-04-03 NOTE — Telephone Encounter (Signed)
Requested Prescriptions   Signed Prescriptions Disp Refills  . potassium chloride SA (KLOR-CON M20) 20 MEQ tablet 30 tablet 3    Sig: TAKE ONE TABLET BY MOUTH ONCE DAILY    Authorizing Provider: Antonieta Iba    Ordering User: Kendrick Fries

## 2013-05-14 ENCOUNTER — Ambulatory Visit (INDEPENDENT_AMBULATORY_CARE_PROVIDER_SITE_OTHER): Payer: Medicare Other

## 2013-05-14 DIAGNOSIS — I4891 Unspecified atrial fibrillation: Secondary | ICD-10-CM

## 2013-05-14 DIAGNOSIS — Z7901 Long term (current) use of anticoagulants: Secondary | ICD-10-CM

## 2013-05-14 DIAGNOSIS — Z9889 Other specified postprocedural states: Secondary | ICD-10-CM

## 2013-05-14 LAB — POCT INR: INR: 3.1

## 2013-05-29 ENCOUNTER — Other Ambulatory Visit: Payer: Self-pay | Admitting: Cardiovascular Disease

## 2013-06-18 ENCOUNTER — Ambulatory Visit (INDEPENDENT_AMBULATORY_CARE_PROVIDER_SITE_OTHER): Payer: Medicare Other

## 2013-06-18 DIAGNOSIS — I4891 Unspecified atrial fibrillation: Secondary | ICD-10-CM

## 2013-06-18 DIAGNOSIS — Z9889 Other specified postprocedural states: Secondary | ICD-10-CM

## 2013-06-18 DIAGNOSIS — Z7901 Long term (current) use of anticoagulants: Secondary | ICD-10-CM

## 2013-06-18 LAB — POCT INR: INR: 2.6

## 2013-06-30 ENCOUNTER — Other Ambulatory Visit: Payer: Self-pay | Admitting: Cardiovascular Disease

## 2013-07-30 ENCOUNTER — Ambulatory Visit (INDEPENDENT_AMBULATORY_CARE_PROVIDER_SITE_OTHER): Payer: Medicare Other

## 2013-07-30 ENCOUNTER — Other Ambulatory Visit: Payer: Self-pay | Admitting: Cardiovascular Disease

## 2013-07-30 DIAGNOSIS — I4891 Unspecified atrial fibrillation: Secondary | ICD-10-CM

## 2013-07-30 DIAGNOSIS — Z7901 Long term (current) use of anticoagulants: Secondary | ICD-10-CM

## 2013-07-30 DIAGNOSIS — Z9889 Other specified postprocedural states: Secondary | ICD-10-CM

## 2013-07-30 LAB — POCT INR: INR: 2.4

## 2013-07-31 ENCOUNTER — Other Ambulatory Visit: Payer: Self-pay | Admitting: Cardiovascular Disease

## 2013-09-10 ENCOUNTER — Ambulatory Visit (INDEPENDENT_AMBULATORY_CARE_PROVIDER_SITE_OTHER): Payer: Medicare Other

## 2013-09-10 DIAGNOSIS — Z9889 Other specified postprocedural states: Secondary | ICD-10-CM

## 2013-09-10 DIAGNOSIS — I4891 Unspecified atrial fibrillation: Secondary | ICD-10-CM

## 2013-09-10 DIAGNOSIS — Z7901 Long term (current) use of anticoagulants: Secondary | ICD-10-CM

## 2013-09-10 LAB — POCT INR: INR: 2.6

## 2013-10-29 ENCOUNTER — Ambulatory Visit (INDEPENDENT_AMBULATORY_CARE_PROVIDER_SITE_OTHER): Payer: Medicare Other

## 2013-10-29 DIAGNOSIS — Z7901 Long term (current) use of anticoagulants: Secondary | ICD-10-CM

## 2013-10-29 DIAGNOSIS — Z9889 Other specified postprocedural states: Secondary | ICD-10-CM

## 2013-10-29 DIAGNOSIS — I4891 Unspecified atrial fibrillation: Secondary | ICD-10-CM

## 2013-10-29 LAB — POCT INR: INR: 2.6

## 2013-11-26 ENCOUNTER — Other Ambulatory Visit: Payer: Self-pay | Admitting: Cardiovascular Disease

## 2013-12-10 ENCOUNTER — Ambulatory Visit (INDEPENDENT_AMBULATORY_CARE_PROVIDER_SITE_OTHER): Payer: Medicare Other

## 2013-12-10 DIAGNOSIS — I4891 Unspecified atrial fibrillation: Secondary | ICD-10-CM

## 2013-12-10 DIAGNOSIS — Z9889 Other specified postprocedural states: Secondary | ICD-10-CM

## 2013-12-10 DIAGNOSIS — Z7901 Long term (current) use of anticoagulants: Secondary | ICD-10-CM

## 2013-12-10 LAB — POCT INR: INR: 3.2

## 2013-12-23 LAB — TSH: TSH: 1.23 u[IU]/mL (ref <=5.90)

## 2014-01-07 ENCOUNTER — Ambulatory Visit (INDEPENDENT_AMBULATORY_CARE_PROVIDER_SITE_OTHER): Payer: Medicare Other

## 2014-01-07 DIAGNOSIS — I4891 Unspecified atrial fibrillation: Secondary | ICD-10-CM

## 2014-01-07 DIAGNOSIS — Z7901 Long term (current) use of anticoagulants: Secondary | ICD-10-CM

## 2014-01-07 DIAGNOSIS — Z9889 Other specified postprocedural states: Secondary | ICD-10-CM

## 2014-01-07 LAB — POCT INR: INR: 3.7

## 2014-01-28 ENCOUNTER — Ambulatory Visit (INDEPENDENT_AMBULATORY_CARE_PROVIDER_SITE_OTHER): Payer: Medicare Other

## 2014-01-28 DIAGNOSIS — Z7901 Long term (current) use of anticoagulants: Secondary | ICD-10-CM

## 2014-01-28 DIAGNOSIS — Z9889 Other specified postprocedural states: Secondary | ICD-10-CM

## 2014-01-28 DIAGNOSIS — I4891 Unspecified atrial fibrillation: Secondary | ICD-10-CM

## 2014-01-28 LAB — POCT INR: INR: 2.5

## 2014-02-03 ENCOUNTER — Ambulatory Visit: Payer: Self-pay | Admitting: Family Medicine

## 2014-02-25 ENCOUNTER — Ambulatory Visit (INDEPENDENT_AMBULATORY_CARE_PROVIDER_SITE_OTHER): Payer: Medicare Other

## 2014-02-25 DIAGNOSIS — Z7901 Long term (current) use of anticoagulants: Secondary | ICD-10-CM

## 2014-02-25 DIAGNOSIS — Z9889 Other specified postprocedural states: Secondary | ICD-10-CM

## 2014-02-25 DIAGNOSIS — I4891 Unspecified atrial fibrillation: Secondary | ICD-10-CM

## 2014-02-25 LAB — POCT INR: INR: 2.4

## 2014-03-12 ENCOUNTER — Ambulatory Visit (INDEPENDENT_AMBULATORY_CARE_PROVIDER_SITE_OTHER): Payer: Medicare Other | Admitting: Cardiovascular Disease

## 2014-03-12 ENCOUNTER — Encounter: Payer: Self-pay | Admitting: Cardiovascular Disease

## 2014-03-12 VITALS — BP 164/78 | HR 62 | Ht 62.0 in | Wt 108.8 lb

## 2014-03-12 DIAGNOSIS — E785 Hyperlipidemia, unspecified: Secondary | ICD-10-CM

## 2014-03-12 DIAGNOSIS — Z7901 Long term (current) use of anticoagulants: Secondary | ICD-10-CM

## 2014-03-12 DIAGNOSIS — Z9889 Other specified postprocedural states: Secondary | ICD-10-CM

## 2014-03-12 DIAGNOSIS — I2510 Atherosclerotic heart disease of native coronary artery without angina pectoris: Secondary | ICD-10-CM

## 2014-03-12 DIAGNOSIS — I4891 Unspecified atrial fibrillation: Secondary | ICD-10-CM

## 2014-03-12 DIAGNOSIS — I10 Essential (primary) hypertension: Secondary | ICD-10-CM

## 2014-03-12 NOTE — Progress Notes (Signed)
Patient ID: Kathy Mccall, female    DOB: 28-Jan-1924, 80 y.o.   MRN: [id number]  HPI Comments: Kathy Mccall is a very pleasant 80 year old woman with a history of rheumatic valve disease, St. Jude aortic valve replacement in 1991, chronic atrial fibrillation on warfarin,  severe tricuspid regurgitation on echocardiogram dating back to 2008, previous history of mild to moderate pulmonary hypertension (right ventricular systolic pressure estimated at 46), who presents for routine followup of her atrial fibrillation and prosthetic valve  . She has been stable on Lasix daily.  In follow-up today, she reports that she is doing well, occasionally has swelling in her ankles but this typically resolves without intervention. No significant shortness of breath with exertion. Legs are getting weaker, she tries to stay active. Sometimes  walks with a 4 prong cane and otherwise has no complaints.    echo was in early 2014 . Normal ejection fraction, severe TR, well-seated aortic valve .  Some gait instability. No falls. No complications on warfarin  EKG today shows atrial fibrillation with ventricular rate 68 bpm, no significant ST or T wave changes   Other past medical history  Cardiac catheterization in August 2008 showed mild to moderate distal left main disease, moderate ostial left circumflex disease, mild ostial LAD and ostial B1 disease, severe disease of the PL range medical management was recommended.   In the past, she has had difficulty tolerating Lipitor, Now on low-dose pravastatin Total cholesterol 166, LDL 85, HDL 65      Outpatient Encounter Prescriptions as of 03/12/2014  Medication Sig  . calcium-vitamin D (OSCAL 500/200 D-3) 500-200 MG-UNIT per tablet Take 1 tablet by mouth daily.    [name] DAILY MULTIPLE VITAMINS tablet Take 1 tablet by mouth daily.    . furosemide (LASIX) 20 MG tablet TAKE ONE TABLET BY MOUTH ONCE DAILY  . HYDROcodone-acetaminophen (VICODIN) 5-500 MG per tablet Take  0.5 tablets by mouth 2 (two) times daily.    . iron polysaccharides (FERREX 150) 150 MG capsule Take 150 mg by mouth daily.   [name] KLOR-CON M20 20 MEQ tablet TAKE ONE TABLET BY MOUTH ONCE DAILY  . latanoprost (XALATAN) 0.005 % ophthalmic solution 1 drop at bedtime.    [name] levothyroxine (SYNTHROID, LEVOTHROID) 50 MCG tablet Take 50 mcg by mouth daily.    [name] lisinopril (PRINIVIL,ZESTRIL) 10 MG tablet Take 10 mg by mouth daily.    [name] omeprazole (PRILOSEC) 20 MG capsule Take 20 mg by mouth daily.    . pravastatin (PRAVACHOL) 20 MG tablet Take 20 mg by mouth daily.    . timolol (TIMOPTIC) 0.5 % ophthalmic solution 1 drop.    [name] warfarin (COUMADIN) 3 MG tablet Take 3 mg by mouth as directed.    . [DISCONTINUED] KLOR-CON M20 20 MEQ tablet TAKE ONE TABLET BY MOUTH ONCE DAILY    Social history  reports that she has never smoked. She does not have any smokeless tobacco history on file. She reports that she does not drink alcohol.  Review of Systems  Constitutional: Negative.   Eyes: Negative.   Respiratory: Negative.   Cardiovascular: Negative.   Gastrointestinal: Negative.   Musculoskeletal: Positive for gait problem.  Neurological: Negative.   Hematological: Negative.   Psychiatric/Behavioral: Negative.   All other systems reviewed and are negative.   BP 164/78 mmHg  Pulse 62  Ht 5\' 2"  (1.575 m)  Wt 108 lb 12 oz (49.329 kg)  BMI 19.89 kg/m2  Physical Exam  Constitutional: She is oriented to  person, place, and time. She appears well-developed and well-nourished.  HENT:  Head: Normocephalic.  Nose: Nose normal.  Mouth/Throat: Oropharynx is clear and moist.  Eyes: Conjunctivae are normal. Pupils are equal, round, and reactive to light.  Neck: Normal range of motion. Neck supple. No JVD present.  Cardiovascular: Normal rate, S1 normal, S2 normal and intact distal pulses.  An irregularly irregular rhythm present. Exam reveals no gallop and no friction rub.   Murmur heard.  Systolic murmur is  present with a grade of 2/6  Pulmonary/Chest: Effort normal and breath sounds normal. No respiratory distress. She has no wheezes. She has no rales. She exhibits no tenderness.  Abdominal: Soft. Bowel sounds are normal. She exhibits no distension. There is no tenderness.  Musculoskeletal: Normal range of motion. She exhibits no edema or tenderness.  Lymphadenopathy:    She has no cervical adenopathy.  Neurological: She is alert and oriented to person, place, and time. Coordination normal.  Skin: Skin is warm and dry. No rash noted. No erythema.  Psychiatric: She has a normal mood and affect. Her behavior is normal. Judgment and thought content normal.    Assessment and Plan  Nursing note and vitals reviewed.

## 2014-03-12 NOTE — Patient Instructions (Addendum)
You are doing well. No medication changes were made.  Your blood pressure is running a little high, Goal blood pressure <140 on the top number Monitor your blood pressure at home, If it runs high, call the office at [phone]  Please call us if you have new issues that need to be addressed before your next appt.  Your physician wants you to follow-up in: 6 months.  You will receive a reminder letter in the mail two months in advance. If you don't receive a letter, please call our office to schedule the follow-up appointment.

## 2014-03-12 NOTE — Assessment & Plan Note (Signed)
Encouraged her to stay on her pravastatin

## 2014-03-12 NOTE — Assessment & Plan Note (Signed)
No recent falls, no excessive bruising, no signs of bleeding. Stable on her warfarin

## 2014-03-12 NOTE — Assessment & Plan Note (Signed)
Currently with no symptoms of angina. No further workup at this time. Continue current medication regimen. 

## 2014-03-12 NOTE — Assessment & Plan Note (Signed)
Heart rate well controlled. Tolerating anticoagulation. No changes to her medications

## 2014-03-12 NOTE — Assessment & Plan Note (Signed)
We'll recommend follow-up echocardiogram in 2016

## 2014-03-12 NOTE — Assessment & Plan Note (Addendum)
Blood pressure elevated today, even on recheck. She will borrow the blood pressure of her family member and monitor her blood pressure at home and call our office for systolic pressures more than 140

## 2014-03-26 ENCOUNTER — Other Ambulatory Visit: Payer: Self-pay | Admitting: Cardiovascular Disease

## 2014-04-01 ENCOUNTER — Ambulatory Visit (INDEPENDENT_AMBULATORY_CARE_PROVIDER_SITE_OTHER): Payer: Medicare Other

## 2014-04-01 DIAGNOSIS — I4891 Unspecified atrial fibrillation: Secondary | ICD-10-CM

## 2014-04-01 DIAGNOSIS — Z9889 Other specified postprocedural states: Secondary | ICD-10-CM

## 2014-04-01 DIAGNOSIS — Z7901 Long term (current) use of anticoagulants: Secondary | ICD-10-CM

## 2014-04-01 LAB — POCT INR: INR: 2.2

## 2014-05-13 ENCOUNTER — Ambulatory Visit (INDEPENDENT_AMBULATORY_CARE_PROVIDER_SITE_OTHER): Payer: Medicare Other

## 2014-05-13 DIAGNOSIS — I4891 Unspecified atrial fibrillation: Secondary | ICD-10-CM | POA: Diagnosis not present

## 2014-05-13 DIAGNOSIS — Z9889 Other specified postprocedural states: Secondary | ICD-10-CM

## 2014-05-13 DIAGNOSIS — Z7901 Long term (current) use of anticoagulants: Secondary | ICD-10-CM | POA: Diagnosis not present

## 2014-05-13 LAB — POCT INR: INR: 2.7

## 2014-06-02 DIAGNOSIS — H4011X2 Primary open-angle glaucoma, moderate stage: Secondary | ICD-10-CM | POA: Diagnosis not present

## 2014-06-24 ENCOUNTER — Ambulatory Visit (INDEPENDENT_AMBULATORY_CARE_PROVIDER_SITE_OTHER): Payer: Medicare Other | Admitting: *Deleted

## 2014-06-24 DIAGNOSIS — I4891 Unspecified atrial fibrillation: Secondary | ICD-10-CM

## 2014-06-24 DIAGNOSIS — Z9889 Other specified postprocedural states: Secondary | ICD-10-CM

## 2014-06-24 DIAGNOSIS — Z7901 Long term (current) use of anticoagulants: Secondary | ICD-10-CM

## 2014-06-24 LAB — POCT INR: INR: 2.2

## 2014-06-29 DIAGNOSIS — I10 Essential (primary) hypertension: Secondary | ICD-10-CM | POA: Diagnosis not present

## 2014-06-29 DIAGNOSIS — I2510 Atherosclerotic heart disease of native coronary artery without angina pectoris: Secondary | ICD-10-CM | POA: Diagnosis not present

## 2014-07-23 ENCOUNTER — Other Ambulatory Visit: Payer: Self-pay | Admitting: *Deleted

## 2014-07-23 MED ORDER — FUROSEMIDE 20 MG PO TABS: 20.0000 mg | ORAL_TABLET | Freq: Every day | ORAL | Status: DC

## 2014-07-23 MED ORDER — POTASSIUM CHLORIDE CRYS ER 20 MEQ PO TBCR: 20.0000 meq | EXTENDED_RELEASE_TABLET | Freq: Every day | ORAL | Status: DC

## 2014-08-05 ENCOUNTER — Ambulatory Visit (INDEPENDENT_AMBULATORY_CARE_PROVIDER_SITE_OTHER): Payer: Medicare Other

## 2014-08-05 DIAGNOSIS — I4891 Unspecified atrial fibrillation: Secondary | ICD-10-CM

## 2014-08-05 DIAGNOSIS — Z7901 Long term (current) use of anticoagulants: Secondary | ICD-10-CM

## 2014-08-05 DIAGNOSIS — Z9889 Other specified postprocedural states: Secondary | ICD-10-CM

## 2014-08-05 LAB — POCT INR: INR: 2.3

## 2014-09-09 ENCOUNTER — Encounter: Payer: Self-pay | Admitting: Cardiovascular Disease

## 2014-09-09 ENCOUNTER — Ambulatory Visit (INDEPENDENT_AMBULATORY_CARE_PROVIDER_SITE_OTHER): Payer: Medicare Other | Admitting: Cardiovascular Disease

## 2014-09-09 VITALS — BP 145/80 | HR 76 | Ht 63.0 in | Wt 111.0 lb

## 2014-09-09 DIAGNOSIS — I10 Essential (primary) hypertension: Secondary | ICD-10-CM

## 2014-09-09 DIAGNOSIS — Z954 Presence of other heart-valve replacement: Secondary | ICD-10-CM | POA: Diagnosis not present

## 2014-09-09 DIAGNOSIS — I4891 Unspecified atrial fibrillation: Secondary | ICD-10-CM

## 2014-09-09 DIAGNOSIS — Z7901 Long term (current) use of anticoagulants: Secondary | ICD-10-CM

## 2014-09-09 DIAGNOSIS — E785 Hyperlipidemia, unspecified: Secondary | ICD-10-CM | POA: Diagnosis not present

## 2014-09-09 DIAGNOSIS — I2510 Atherosclerotic heart disease of native coronary artery without angina pectoris: Secondary | ICD-10-CM

## 2014-09-09 DIAGNOSIS — Z9889 Other specified postprocedural states: Secondary | ICD-10-CM

## 2014-09-09 DIAGNOSIS — Z952 Presence of prosthetic heart valve: Secondary | ICD-10-CM

## 2014-09-09 NOTE — Patient Instructions (Signed)
You are doing well. No medication changes were made.  Please call us if you have new issues that need to be addressed before your next appt.  Your physician wants you to follow-up in: 6 months.  You will receive a reminder letter in the mail two months in advance. If you don't receive a letter, please call our office to schedule the follow-up appointment.   

## 2014-09-09 NOTE — Assessment & Plan Note (Signed)
Currently with no symptoms of angina. No further workup at this time. Continue current medication regimen. 

## 2014-09-09 NOTE — Progress Notes (Signed)
Patient ID: Kathy Mccall, female    DOB: April 24, 1924, 81 y.o.   MRN: [id number]  HPI Comments: [name] is a very pleasant 81 year old woman with a history of rheumatic valve disease, St. Jude aortic valve replacement in 1991, chronic atrial fibrillation on warfarin,  severe tricuspid regurgitation on echocardiogram dating back to 2008, previous history of mild to moderate pulmonary hypertension (right ventricular systolic pressure estimated at 46), who presents for routine followup of her atrial fibrillation and prosthetic valve  . She has been stable on Lasix daily.  In follow-up she reports that she has been doing well. Blood pressure elevated in the office today but she reports it is never this high at home, typically less than 974 systolic She reports no shortness of breath, no chest pain, is active at baseline Continues to walk with a cane, no recent falls,  Some gait instability. otherwise no complaints Rare swelling in her ankles  EKG on today's visit shows atrial fibrillation with ventricular rate 76 bpm, nonspecific ST abnormality  Other past medical history  echo was in early 2014 . Normal ejection fraction, severe TR, well-seated aortic valve .  No falls. No complications on warfarin  Cardiac catheterization in August 2008 showed mild to moderate distal left main disease, moderate ostial left circumflex disease, mild ostial LAD and ostial B1 disease, severe disease of the PL range medical management was recommended.   In the past, she has had difficulty tolerating Lipitor, Now on low-dose pravastatin Total cholesterol 166, LDL 85, HDL 65     Allergies  Allergen Reactions  . Sulfonamide Derivatives     Current Outpatient Prescriptions on File Prior to Visit  Medication Sig Dispense Refill  . calcium-vitamin D (OSCAL 500/200 D-3) 500-200 MG-UNIT per tablet Take 1 tablet by mouth daily.      [name] DAILY MULTIPLE VITAMINS tablet Take 1 tablet by mouth daily.      .  furosemide (LASIX) 20 MG tablet Take 1 tablet (20 mg total) by mouth daily. 30 tablet 3  . HYDROcodone-acetaminophen (VICODIN) 5-500 MG per tablet Take 0.5 tablets by mouth 2 (two) times daily.      . iron polysaccharides (FERREX 150) 150 MG capsule Take 150 mg by mouth daily.     [name] latanoprost (XALATAN) 0.005 % ophthalmic solution 1 drop at bedtime.      [name] levothyroxine (SYNTHROID, LEVOTHROID) 50 MCG tablet Take 50 mcg by mouth daily.      [name] lisinopril (PRINIVIL,ZESTRIL) 10 MG tablet Take 10 mg by mouth daily.      [name] omeprazole (PRILOSEC) 20 MG capsule Take 20 mg by mouth daily.      . potassium chloride SA (KLOR-CON M20) 20 MEQ tablet Take 1 tablet (20 mEq total) by mouth daily. 30 tablet 3  . pravastatin (PRAVACHOL) 20 MG tablet Take 20 mg by mouth daily.      . timolol (TIMOPTIC) 0.5 % ophthalmic solution 1 drop.      [name] warfarin (COUMADIN) 3 MG tablet Take 3 mg by mouth as directed.       No current facility-administered medications on file prior to visit.    Past Medical History  Diagnosis Date  . Arthritis   . Atrial fibrillation   . Congestive heart failure   . S/P aortic valve replacement     st. jude  . Rheumatic fever     Past Surgical History  Procedure Laterality Date  . Aortic valve replacement      Social  History  reports that she has never smoked. She does not have any smokeless tobacco history on file. She reports that she does not drink alcohol.  Family History Family history is unknown by patient.  Review of Systems  Constitutional: Negative.   Eyes: Negative.   Respiratory: Negative.   Cardiovascular: Negative.   Gastrointestinal: Negative.   Musculoskeletal: Positive for gait problem.  Neurological: Negative.   Hematological: Negative.   Psychiatric/Behavioral: Negative.   All other systems reviewed and are negative.   BP 145/80 mmHg  Pulse 76  Ht 5\' 3"  (1.6 m)  Wt 111 lb (50.349 kg)  BMI 19.67 kg/m2  Physical Exam  Constitutional: She is  oriented to person, place, and time. She appears well-developed and well-nourished.  HENT:  Head: Normocephalic.  Nose: Nose normal.  Mouth/Throat: Oropharynx is clear and moist.  Eyes: Conjunctivae are normal. Pupils are equal, round, and reactive to light.  Neck: Normal range of motion. Neck supple. No JVD present.  Cardiovascular: Normal rate, S1 normal, S2 normal and intact distal pulses.  An irregularly irregular rhythm present. Exam reveals no gallop and no friction rub.   Murmur heard.  Systolic murmur is present with a grade of 2/6  Pulmonary/Chest: Effort normal and breath sounds normal. No respiratory distress. She has no wheezes. She has no rales. She exhibits no tenderness.  Abdominal: Soft. Bowel sounds are normal. She exhibits no distension. There is no tenderness.  Musculoskeletal: Normal range of motion. She exhibits no edema or tenderness.  Lymphadenopathy:    She has no cervical adenopathy.  Neurological: She is alert and oriented to person, place, and time. Coordination normal.  Skin: Skin is warm and dry. No rash noted. No erythema.  Psychiatric: She has a normal mood and affect. Her behavior is normal. Judgment and thought content normal.    Assessment and Plan  Nursing note and vitals reviewed.

## 2014-09-09 NOTE — Assessment & Plan Note (Signed)
Tolerating warfarin, in general INR in the low 2 range

## 2014-09-09 NOTE — Assessment & Plan Note (Signed)
We have ordered repeat echocardiogram to evaluate her aortic valve prosthesis

## 2014-09-09 NOTE — Assessment & Plan Note (Signed)
Chronic age fibrillation, heart rate well controlled. On warfarin

## 2014-09-09 NOTE — Assessment & Plan Note (Signed)
Recommended that she stay on her pravastatin

## 2014-09-23 ENCOUNTER — Ambulatory Visit (INDEPENDENT_AMBULATORY_CARE_PROVIDER_SITE_OTHER): Payer: Medicare Other

## 2014-09-23 DIAGNOSIS — Z9889 Other specified postprocedural states: Secondary | ICD-10-CM

## 2014-09-23 DIAGNOSIS — I4891 Unspecified atrial fibrillation: Secondary | ICD-10-CM | POA: Diagnosis not present

## 2014-09-23 DIAGNOSIS — Z7901 Long term (current) use of anticoagulants: Secondary | ICD-10-CM | POA: Diagnosis not present

## 2014-09-23 LAB — POCT INR: INR: 2.3

## 2014-10-13 ENCOUNTER — Ambulatory Visit (INDEPENDENT_AMBULATORY_CARE_PROVIDER_SITE_OTHER): Payer: Medicare Other

## 2014-10-13 ENCOUNTER — Other Ambulatory Visit: Payer: Self-pay

## 2014-10-13 DIAGNOSIS — Z952 Presence of prosthetic heart valve: Secondary | ICD-10-CM

## 2014-10-13 DIAGNOSIS — I4891 Unspecified atrial fibrillation: Secondary | ICD-10-CM

## 2014-10-13 DIAGNOSIS — Z954 Presence of other heart-valve replacement: Secondary | ICD-10-CM

## 2014-10-16 ENCOUNTER — Other Ambulatory Visit: Payer: Self-pay | Admitting: Family Medicine

## 2014-10-16 DIAGNOSIS — M1990 Unspecified osteoarthritis, unspecified site: Secondary | ICD-10-CM

## 2014-10-16 NOTE — Telephone Encounter (Signed)
Pt contacted office for refill request on the following medications: HYDROcodone-acetaminophen (VICODIN) 5-500 MG.  CB#[phone]/[phone]   This is a Dr Rosanna Randy pt

## 2014-10-16 NOTE — Telephone Encounter (Signed)
Supposed to be 5-325mg  tablet.

## 2014-10-21 ENCOUNTER — Other Ambulatory Visit: Payer: Self-pay | Admitting: Family Medicine

## 2014-10-21 MED ORDER — HYDROCODONE-ACETAMINOPHEN 5-325 MG PO TABS: ORAL_TABLET | ORAL | Status: DC

## 2014-10-21 MED ORDER — HYDROCODONE-ACETAMINOPHEN 5-500 MG PO TABS: ORAL_TABLET | ORAL | Status: DC

## 2014-10-21 NOTE — Telephone Encounter (Signed)
Spoke with patient and she verified and confirmed that the dose of Hydrocodone - Acetaminophen is supposed to be 5-325mg .

## 2014-11-04 ENCOUNTER — Ambulatory Visit (INDEPENDENT_AMBULATORY_CARE_PROVIDER_SITE_OTHER): Payer: Medicare Other

## 2014-11-04 DIAGNOSIS — Z7901 Long term (current) use of anticoagulants: Secondary | ICD-10-CM | POA: Diagnosis not present

## 2014-11-04 DIAGNOSIS — Z9889 Other specified postprocedural states: Secondary | ICD-10-CM | POA: Diagnosis not present

## 2014-11-04 DIAGNOSIS — I4891 Unspecified atrial fibrillation: Secondary | ICD-10-CM | POA: Diagnosis not present

## 2014-11-04 LAB — POCT INR: INR: 2.7

## 2014-11-09 DIAGNOSIS — H4011X2 Primary open-angle glaucoma, moderate stage: Secondary | ICD-10-CM | POA: Diagnosis not present

## 2014-11-16 ENCOUNTER — Other Ambulatory Visit: Payer: Self-pay | Admitting: Family Medicine

## 2014-11-17 ENCOUNTER — Other Ambulatory Visit: Payer: Self-pay | Admitting: *Deleted

## 2014-11-17 MED ORDER — FUROSEMIDE 20 MG PO TABS: 20.0000 mg | ORAL_TABLET | Freq: Every day | ORAL | Status: DC

## 2014-11-17 MED ORDER — POTASSIUM CHLORIDE CRYS ER 20 MEQ PO TBCR: 20.0000 meq | EXTENDED_RELEASE_TABLET | Freq: Every day | ORAL | Status: DC

## 2014-12-03 ENCOUNTER — Other Ambulatory Visit: Payer: Self-pay | Admitting: Family Medicine

## 2014-12-15 ENCOUNTER — Other Ambulatory Visit: Payer: Self-pay | Admitting: Family Medicine

## 2014-12-16 ENCOUNTER — Ambulatory Visit (INDEPENDENT_AMBULATORY_CARE_PROVIDER_SITE_OTHER): Payer: Medicare Other | Admitting: Pharmacist

## 2014-12-16 DIAGNOSIS — Z9889 Other specified postprocedural states: Secondary | ICD-10-CM | POA: Diagnosis not present

## 2014-12-16 DIAGNOSIS — I4891 Unspecified atrial fibrillation: Secondary | ICD-10-CM

## 2014-12-16 DIAGNOSIS — Z7901 Long term (current) use of anticoagulants: Secondary | ICD-10-CM

## 2014-12-16 LAB — POCT INR: INR: 2.7

## 2015-01-04 ENCOUNTER — Ambulatory Visit (INDEPENDENT_AMBULATORY_CARE_PROVIDER_SITE_OTHER): Payer: Medicare Other | Admitting: Family Medicine

## 2015-01-04 ENCOUNTER — Encounter: Payer: Self-pay | Admitting: Family Medicine

## 2015-01-04 VITALS — BP 138/64 | HR 64 | Temp 97.6°F | Resp 16 | Wt 109.4 lb

## 2015-01-04 DIAGNOSIS — R7303 Prediabetes: Secondary | ICD-10-CM | POA: Insufficient documentation

## 2015-01-04 DIAGNOSIS — N951 Menopausal and female climacteric states: Secondary | ICD-10-CM | POA: Insufficient documentation

## 2015-01-04 DIAGNOSIS — R7309 Other abnormal glucose: Secondary | ICD-10-CM | POA: Diagnosis not present

## 2015-01-04 DIAGNOSIS — M1990 Unspecified osteoarthritis, unspecified site: Secondary | ICD-10-CM | POA: Insufficient documentation

## 2015-01-04 DIAGNOSIS — H269 Unspecified cataract: Secondary | ICD-10-CM | POA: Insufficient documentation

## 2015-01-04 DIAGNOSIS — Z952 Presence of prosthetic heart valve: Secondary | ICD-10-CM | POA: Insufficient documentation

## 2015-01-04 DIAGNOSIS — K5790 Diverticulosis of intestine, part unspecified, without perforation or abscess without bleeding: Secondary | ICD-10-CM | POA: Insufficient documentation

## 2015-01-04 DIAGNOSIS — K219 Gastro-esophageal reflux disease without esophagitis: Secondary | ICD-10-CM | POA: Insufficient documentation

## 2015-01-04 DIAGNOSIS — L821 Other seborrheic keratosis: Secondary | ICD-10-CM | POA: Insufficient documentation

## 2015-01-04 DIAGNOSIS — I10 Essential (primary) hypertension: Secondary | ICD-10-CM | POA: Diagnosis not present

## 2015-01-04 DIAGNOSIS — Z8711 Personal history of peptic ulcer disease: Secondary | ICD-10-CM | POA: Insufficient documentation

## 2015-01-04 DIAGNOSIS — E7800 Pure hypercholesterolemia, unspecified: Secondary | ICD-10-CM

## 2015-01-04 DIAGNOSIS — R040 Epistaxis: Secondary | ICD-10-CM | POA: Insufficient documentation

## 2015-01-04 DIAGNOSIS — I38 Endocarditis, valve unspecified: Secondary | ICD-10-CM | POA: Insufficient documentation

## 2015-01-04 DIAGNOSIS — M810 Age-related osteoporosis without current pathological fracture: Secondary | ICD-10-CM | POA: Insufficient documentation

## 2015-01-04 DIAGNOSIS — D649 Anemia, unspecified: Secondary | ICD-10-CM | POA: Insufficient documentation

## 2015-01-04 DIAGNOSIS — Z23 Encounter for immunization: Secondary | ICD-10-CM

## 2015-01-04 DIAGNOSIS — H409 Unspecified glaucoma: Secondary | ICD-10-CM | POA: Insufficient documentation

## 2015-01-04 DIAGNOSIS — I2510 Atherosclerotic heart disease of native coronary artery without angina pectoris: Secondary | ICD-10-CM | POA: Insufficient documentation

## 2015-01-04 DIAGNOSIS — I252 Old myocardial infarction: Secondary | ICD-10-CM | POA: Insufficient documentation

## 2015-01-04 DIAGNOSIS — Z8719 Personal history of other diseases of the digestive system: Secondary | ICD-10-CM | POA: Insufficient documentation

## 2015-01-04 DIAGNOSIS — E039 Hypothyroidism, unspecified: Secondary | ICD-10-CM | POA: Diagnosis not present

## 2015-01-04 MED ORDER — HYDROCODONE-ACETAMINOPHEN 5-325 MG PO TABS: ORAL_TABLET | ORAL | Status: DC

## 2015-01-04 NOTE — Progress Notes (Signed)
Patient ID: IVY PURYEAR, female   DOB: [birth year], 81 y.o.   MRN: [id number]       Patient: Kathy Mccall Female    DOB: 1924-04-03   81 y.o.   MRN: [id number] Visit Date: 01/04/2015  Today's Provider: Wilhemena Durie, MD   Chief Complaint  Patient presents with  . Hypertension    follow-up  . Hyperlipidemia    follow-up  . Hypothyroidism    follow-up   Subjective:    Hypertension This is a chronic problem. The current episode started more than 1 year ago. Pertinent negatives include no anxiety, blurred vision, chest pain, headaches, neck pain, palpitations or shortness of breath.  Hyperlipidemia This is a chronic problem. Pertinent negatives include no chest pain or shortness of breath. Treatments tried: pt on prevachol 20 mg daily. There are no compliance problems.    Last time Labs were on 12/23/2013    Allergies  Allergen Reactions  . Sulfa Antibiotics   . Sulfonamide Derivatives    Previous Medications   CALCIUM-VITAMIN D (OSCAL 500/200 D-3) 500-200 MG-UNIT PER TABLET    Take 1 tablet by mouth daily.     DAILY MULTIPLE VITAMINS TABLET    Take 1 tablet by mouth daily.     FUROSEMIDE (LASIX) 20 MG TABLET    Take 1 tablet (20 mg total) by mouth daily.   HYDROCODONE-ACETAMINOPHEN (NORCO/VICODIN) 5-325 MG PER TABLET    1/2 to 1 tablet twice a day as needed   IRON POLYSACCHARIDES (FERREX 150) 150 MG CAPSULE    Take 150 mg by mouth daily.    IRON POLYSACCHARIDES (FERREX 150) 150 MG CAPSULE    Take 1 tablet by mouth daily.   LATANOPROST (XALATAN) 0.005 % OPHTHALMIC SOLUTION    1 drop at bedtime.     LEVOTHYROXINE (SYNTHROID, LEVOTHROID) 50 MCG TABLET    Take 50 mcg by mouth daily.     LISINOPRIL (PRINIVIL,ZESTRIL) 10 MG TABLET    TAKE ONE TABLET BY MOUTH ONCE DAILY   MULTIPLE VITAMIN PO    Take 1 tablet by mouth daily.   OMEPRAZOLE (PRILOSEC) 20 MG CAPSULE    TAKE ONE CAPSULE BY MOUTH ONCE DAILY   POTASSIUM CHLORIDE SA (KLOR-CON M20) 20 MEQ TABLET    Take 1 tablet  (20 mEq total) by mouth daily.   PRAVASTATIN (PRAVACHOL) 20 MG TABLET    Take 20 mg by mouth daily.     TIMOLOL (TIMOPTIC) 0.5 % OPHTHALMIC SOLUTION    1 drop.     WARFARIN (COUMADIN) 3 MG TABLET    Take 3 mg by mouth as directed.      Review of Systems  Constitutional: Negative.   HENT: Negative.   Eyes: Negative.  Negative for blurred vision.  Respiratory: Negative for shortness of breath.   Cardiovascular: Negative for chest pain and palpitations.  Endocrine: Negative.   Genitourinary: Negative.   Musculoskeletal: Negative for neck pain.  Allergic/Immunologic: Negative.   Neurological: Negative for headaches.    Social History  Substance Use Topics  . Smoking status: Never Smoker   . Smokeless tobacco: Not on file     Comment: tobacco use- no   . Alcohol Use: No   Objective:   BP 138/64 mmHg  Pulse 64  Temp(Src) 97.6 F (36.4 C) (Oral)  Resp 16  Wt 109 lb 6.4 oz (49.624 kg)  SpO2 97%  Physical Exam  Constitutional: She is oriented to person, place, and time. She appears well-developed and well-nourished.  Cachectic white female in no acute distress  HENT:  Head: Normocephalic and atraumatic.  Right Ear: External ear normal.  Left Ear: External ear normal.  Nose: Nose normal.  Eyes: Conjunctivae are normal.  Neck: Neck supple.  Cardiovascular: Normal rate, regular rhythm and normal heart sounds.   Click from the aortic valve replacement noted at the right upper sternal border  Pulmonary/Chest: Effort normal and breath sounds normal.  Abdominal: Soft.  Neurological: She is alert and oriented to person, place, and time.  Skin: Skin is warm and dry.  Psychiatric: She has a normal mood and affect. Her behavior is normal. Thought content normal.        Assessment & Plan:     1. Essential hypertension  - CBC with Differential/Platelet - Comprehensive Metabolic Panel (CMET)  2. Hypercholesteremia - Lipid Panel With LDL/HDL Ratio  3. Hypothyroidism,  unspecified hypothyroidism type  - TSH  4. Borderline diabetes  - HgB A1c  5. Need for influenza vaccination  - Flu vaccine HIGH DOSE PF (Fluzone High dose) 6.s/p Aortic Valve Replacement 7.Malnutition --caloric        Wilhemena Durie, MD  [facility]

## 2015-01-05 LAB — COMPREHENSIVE METABOLIC PANEL
A/G RATIO: 1.4 (ref 1.1–2.5)
ALBUMIN: 4 g/dL (ref 3.5–4.7)
ALT: 16 IU/L (ref 0–32)
AST: 29 IU/L (ref 0–40)
Alkaline Phosphatase: 72 IU/L (ref 39–117)
BUN / CREAT RATIO: 20 (ref 11–26)
BUN: 13 mg/dL (ref 8–27)
Bilirubin Total: 1 mg/dL (ref 0.0–1.2)
CALCIUM: 9.8 mg/dL (ref 8.7–10.3)
CHLORIDE: 99 mmol/L (ref 97–108)
CO2: 25 mmol/L (ref 18–29)
Creatinine, Ser: 0.66 mg/dL (ref 0.57–1.00)
GFR, EST AFRICAN AMERICAN: 91 mL/min/{1.73_m2} (ref >59)
GFR, EST NON AFRICAN AMERICAN: 79 mL/min/{1.73_m2} (ref >59)
GLOBULIN, TOTAL: 2.9 g/dL (ref 1.5–4.5)
Glucose: 119 mg/dL — ABNORMAL HIGH (ref 65–99)
Potassium: 4.5 mmol/L (ref 3.5–5.2)
SODIUM: 138 mmol/L (ref 134–144)
TOTAL PROTEIN: 6.9 g/dL (ref 6.0–8.5)

## 2015-01-05 LAB — CBC WITH DIFFERENTIAL/PLATELET
BASOS: 1 %
Basophils Absolute: 0 10*3/uL (ref 0.0–0.2)
EOS (ABSOLUTE): 0.1 10*3/uL (ref 0.0–0.4)
EOS: 2 %
HEMATOCRIT: 36.4 % (ref 34.0–46.6)
Hemoglobin: 11.9 g/dL (ref 11.1–15.9)
IMMATURE GRANS (ABS): 0 10*3/uL (ref 0.0–0.1)
IMMATURE GRANULOCYTES: 0 %
LYMPHS: 24 %
Lymphocytes Absolute: 1.3 10*3/uL (ref 0.7–3.1)
MCH: 33.2 pg — ABNORMAL HIGH (ref 26.6–33.0)
MCHC: 32.7 g/dL (ref 31.5–35.7)
MCV: 102 fL — AB (ref 79–97)
Monocytes Absolute: 0.6 10*3/uL (ref 0.1–0.9)
Monocytes: 12 %
NEUTROS ABS: 3.3 10*3/uL (ref 1.4–7.0)
NEUTROS PCT: 61 %
Platelets: 199 10*3/uL (ref 150–379)
RBC: 3.58 x10E6/uL — ABNORMAL LOW (ref 3.77–5.28)
RDW: 13.4 % (ref 12.3–15.4)
WBC: 5.4 10*3/uL (ref 3.4–10.8)

## 2015-01-05 LAB — HEMOGLOBIN A1C
ESTIMATED AVERAGE GLUCOSE: 126 mg/dL
Hgb A1c MFr Bld: 6 % — ABNORMAL HIGH (ref 4.8–5.6)

## 2015-01-05 LAB — LIPID PANEL WITH LDL/HDL RATIO
CHOLESTEROL TOTAL: 144 mg/dL (ref 100–199)
HDL: 57 mg/dL (ref >39)
LDL CALC: 72 mg/dL (ref 0–99)
LDl/HDL Ratio: 1.3 ratio units (ref 0.0–3.2)
TRIGLYCERIDES: 77 mg/dL (ref 0–149)
VLDL CHOLESTEROL CAL: 15 mg/dL (ref 5–40)

## 2015-01-05 LAB — TSH: TSH: 1.54 u[IU]/mL (ref 0.450–4.500)

## 2015-01-13 ENCOUNTER — Telehealth: Payer: Self-pay | Admitting: Family Medicine

## 2015-01-13 ENCOUNTER — Other Ambulatory Visit: Payer: Self-pay | Admitting: Family Medicine

## 2015-01-13 NOTE — Telephone Encounter (Signed)
Pt was in about a week ago and had labs done.  She said she has not heard anything yet .  Can someonne please call her  Call back is [phone]  Thanks Con Memos

## 2015-01-13 NOTE — Telephone Encounter (Signed)
Pt advised-aa 

## 2015-01-27 ENCOUNTER — Ambulatory Visit (INDEPENDENT_AMBULATORY_CARE_PROVIDER_SITE_OTHER): Payer: Medicare Other

## 2015-01-27 DIAGNOSIS — Z9889 Other specified postprocedural states: Secondary | ICD-10-CM | POA: Diagnosis not present

## 2015-01-27 DIAGNOSIS — I4891 Unspecified atrial fibrillation: Secondary | ICD-10-CM | POA: Diagnosis not present

## 2015-01-27 DIAGNOSIS — Z7901 Long term (current) use of anticoagulants: Secondary | ICD-10-CM | POA: Diagnosis not present

## 2015-01-27 LAB — POCT INR: INR: 2.7

## 2015-02-14 ENCOUNTER — Other Ambulatory Visit: Payer: Self-pay | Admitting: Family Medicine

## 2015-03-03 ENCOUNTER — Other Ambulatory Visit: Payer: Self-pay | Admitting: Family Medicine

## 2015-03-08 ENCOUNTER — Ambulatory Visit (INDEPENDENT_AMBULATORY_CARE_PROVIDER_SITE_OTHER): Payer: Medicare Other

## 2015-03-08 ENCOUNTER — Ambulatory Visit (INDEPENDENT_AMBULATORY_CARE_PROVIDER_SITE_OTHER): Payer: Medicare Other | Admitting: Cardiovascular Disease

## 2015-03-08 ENCOUNTER — Encounter: Payer: Self-pay | Admitting: Cardiovascular Disease

## 2015-03-08 VITALS — BP 130/80 | HR 86 | Ht 63.0 in | Wt 106.5 lb

## 2015-03-08 DIAGNOSIS — I4891 Unspecified atrial fibrillation: Secondary | ICD-10-CM

## 2015-03-08 DIAGNOSIS — Z7189 Other specified counseling: Secondary | ICD-10-CM

## 2015-03-08 DIAGNOSIS — R7303 Prediabetes: Secondary | ICD-10-CM | POA: Diagnosis not present

## 2015-03-08 DIAGNOSIS — Z9889 Other specified postprocedural states: Secondary | ICD-10-CM | POA: Diagnosis not present

## 2015-03-08 DIAGNOSIS — E785 Hyperlipidemia, unspecified: Secondary | ICD-10-CM

## 2015-03-08 DIAGNOSIS — I2510 Atherosclerotic heart disease of native coronary artery without angina pectoris: Secondary | ICD-10-CM

## 2015-03-08 DIAGNOSIS — Z7901 Long term (current) use of anticoagulants: Secondary | ICD-10-CM | POA: Diagnosis not present

## 2015-03-08 DIAGNOSIS — I10 Essential (primary) hypertension: Secondary | ICD-10-CM

## 2015-03-08 LAB — POCT INR: INR: 3

## 2015-03-08 NOTE — Assessment & Plan Note (Signed)
Cholesterol is at goal on the current lipid regimen. No changes to the medications were made.  

## 2015-03-08 NOTE — Patient Instructions (Signed)
You are doing well. No medication changes were made.  Please call us if you have new issues that need to be addressed before your next appt.  Your physician wants you to follow-up in: 6 months.  You will receive a reminder letter in the mail two months in advance. If you don't receive a letter, please call our office to schedule the follow-up appointment.   

## 2015-03-08 NOTE — Assessment & Plan Note (Signed)
Blood pressure is well controlled on today's visit. No changes made to the medications. 

## 2015-03-08 NOTE — Assessment & Plan Note (Signed)
Currently with no symptoms of angina. No further workup at this time. Continue current medication regimen. 

## 2015-03-08 NOTE — Assessment & Plan Note (Signed)
Echocardiogram June 2016 reviewed with her, valve is over 79 years old, functioning well

## 2015-03-08 NOTE — Assessment & Plan Note (Signed)
Chronic atrial fibrillation, rate well controlled.  Tolerating anticoagulation 

## 2015-03-08 NOTE — Assessment & Plan Note (Signed)
Borderline diabetes, stable. Unable to exercise, she is watching her diet

## 2015-03-08 NOTE — Assessment & Plan Note (Signed)
Tolerating warfarin. Check every 6 weeks. No recent falls

## 2015-03-08 NOTE — Progress Notes (Signed)
Patient ID: Kathy Mccall, female    DOB: 1923-05-11, 81 y.o.   MRN: [id number]  HPI Comments: Kathy Mccall is a very pleasant 81 year old woman with a history of rheumatic valve disease, [employer] Jude aortic valve replacement in 1991, chronic atrial fibrillation on warfarin,  severe tricuspid regurgitation on echocardiogram dating back to 2008, previous history of mild to moderate pulmonary hypertension (right ventricular systolic pressure estimated at 46), who presents for routine followup of her atrial fibrillation and prosthetic valve  .  Kathy Mccall has been stable on Lasix daily.  In follow-up today, Kathy Mccall reports that Kathy Mccall has been doing very well. No new complaints Walks with a cane, no recent falls Blood pressure well controlled at home Kathy Mccall reports no shortness of breath, no chest pain, is active at baseline  EKG on today's visit shows atrial fibrillation with ventricular rate 86 bpm, nonspecific ST abnormality, rare PVC  Other past medical history  echo was in early 2014 . Normal ejection fraction, severe TR, well-seated aortic valve .  No falls. No complications on warfarin  Cardiac catheterization in August 2008 showed mild to moderate distal left main disease, moderate ostial left circumflex disease, mild ostial LAD and ostial B1 disease, severe disease of the PL range medical management was recommended.   In the past, Kathy Mccall has had difficulty tolerating Lipitor, Now on low-dose pravastatin Total cholesterol 166, LDL 85, HDL 65     Allergies  Allergen Reactions  . Sulfa Antibiotics   . Sulfonamide Derivatives     Current Outpatient Prescriptions on File Prior to Visit  Medication Sig Dispense Refill  . calcium-vitamin D (OSCAL 500/200 D-3) 500-200 MG-UNIT per tablet Take 1 tablet by mouth daily.      [name] DAILY MULTIPLE VITAMINS tablet Take 1 tablet by mouth daily.      . furosemide (LASIX) 20 MG tablet Take 1 tablet (20 mg total) by mouth daily. 30 tablet 3  .  HYDROcodone-acetaminophen (NORCO/VICODIN) 5-325 MG per tablet 1/2 to 1 tablet twice a day as needed 60 tablet 0  . iron polysaccharides (FERREX 150) 150 MG capsule Take 150 mg by mouth daily.     [name] latanoprost (XALATAN) 0.005 % ophthalmic solution 1 drop at bedtime.      [name] levothyroxine (SYNTHROID, LEVOTHROID) 50 MCG tablet Take 50 mcg by mouth daily.      [name] lisinopril (PRINIVIL,ZESTRIL) 10 MG tablet TAKE ONE TABLET BY MOUTH ONCE DAILY 90 tablet 3  . MULTIPLE VITAMIN PO Take 1 tablet by mouth daily.    [name] omeprazole (PRILOSEC) 20 MG capsule TAKE ONE CAPSULE BY MOUTH ONCE DAILY 30 capsule 0  . potassium chloride SA (KLOR-CON M20) 20 MEQ tablet Take 1 tablet (20 mEq total) by mouth daily. 30 tablet 3  . pravastatin (PRAVACHOL) 20 MG tablet Take 20 mg by mouth daily.      . timolol (TIMOPTIC) 0.5 % ophthalmic solution 1 drop.      [name] warfarin (COUMADIN) 3 MG tablet TAKE ONE TABLET BY MOUTH ONCE DAILY 30 tablet 12   No current facility-administered medications on file prior to visit.    Past Medical History  Diagnosis Date  . Arthritis   . Atrial fibrillation ([place])   . Congestive heart failure ([place])   . S/P aortic valve replacement     st. jude  . Rheumatic fever     Past Surgical History  Procedure Laterality Date  . Aortic valve replacement      Social History  reports that  Kathy Mccall has never smoked. Kathy Mccall does not have any smokeless tobacco history on file. Kathy Mccall reports that Kathy Mccall does not drink alcohol.  Family History Family history is unknown by patient.  Review of Systems  Constitutional: Negative.   Eyes: Negative.   Respiratory: Negative.   Cardiovascular: Negative.   Gastrointestinal: Negative.   Musculoskeletal: Positive for gait problem.  Neurological: Negative.   Hematological: Negative.   Psychiatric/Behavioral: Negative.   All other systems reviewed and are negative.   BP 130/80 mmHg  Pulse 86  Ht 5\' 3"  (1.6 m)  Wt 106 lb 8 oz (48.308 kg)  BMI 18.87  kg/m2  Physical Exam  Constitutional: Kathy Mccall is oriented to person, place, and time. Kathy Mccall appears well-developed and well-nourished.  HENT:  Head: Normocephalic.  Nose: Nose normal.  Mouth/Throat: Oropharynx is clear and moist.  Eyes: Conjunctivae are normal. Pupils are equal, round, and reactive to light.  Neck: Normal range of motion. Neck supple. No JVD present.  Cardiovascular: Normal rate, S1 normal, S2 normal and intact distal pulses.  An irregularly irregular rhythm present. Exam reveals no gallop and no friction rub.   Murmur heard.  Systolic murmur is present with a grade of 2/6  Pulmonary/Chest: Effort normal and breath sounds normal. No respiratory distress. Kathy Mccall has no wheezes. Kathy Mccall has no rales. Kathy Mccall exhibits no tenderness.  Abdominal: Soft. Bowel sounds are normal. Kathy Mccall exhibits no distension. There is no tenderness.  Musculoskeletal: Normal range of motion. Kathy Mccall exhibits no edema or tenderness.  Lymphadenopathy:    Kathy Mccall has no cervical adenopathy.  Neurological: Kathy Mccall is alert and oriented to person, place, and time. Coordination normal.  Skin: Skin is warm and dry. No rash noted. No erythema.  Psychiatric: Kathy Mccall has a normal mood and affect. Her behavior is normal. Judgment and thought content normal.    Assessment and Plan  Nursing note and vitals reviewed.

## 2015-03-11 DIAGNOSIS — H401132 Primary open-angle glaucoma, bilateral, moderate stage: Secondary | ICD-10-CM | POA: Diagnosis not present

## 2015-03-16 ENCOUNTER — Other Ambulatory Visit: Payer: Self-pay | Admitting: Family Medicine

## 2015-03-24 ENCOUNTER — Other Ambulatory Visit: Payer: Self-pay | Admitting: Cardiovascular Disease

## 2015-04-13 ENCOUNTER — Other Ambulatory Visit: Payer: Self-pay | Admitting: Family Medicine

## 2015-04-14 ENCOUNTER — Ambulatory Visit (INDEPENDENT_AMBULATORY_CARE_PROVIDER_SITE_OTHER): Payer: Medicare Other

## 2015-04-14 DIAGNOSIS — Z9889 Other specified postprocedural states: Secondary | ICD-10-CM

## 2015-04-14 DIAGNOSIS — I4891 Unspecified atrial fibrillation: Secondary | ICD-10-CM

## 2015-04-14 DIAGNOSIS — Z7901 Long term (current) use of anticoagulants: Secondary | ICD-10-CM

## 2015-04-14 LAB — POCT INR: INR: 2.5

## 2015-05-26 ENCOUNTER — Ambulatory Visit (INDEPENDENT_AMBULATORY_CARE_PROVIDER_SITE_OTHER): Payer: Medicare Other

## 2015-05-26 DIAGNOSIS — I4891 Unspecified atrial fibrillation: Secondary | ICD-10-CM | POA: Diagnosis not present

## 2015-05-26 DIAGNOSIS — Z9889 Other specified postprocedural states: Secondary | ICD-10-CM

## 2015-05-26 DIAGNOSIS — Z7901 Long term (current) use of anticoagulants: Secondary | ICD-10-CM | POA: Diagnosis not present

## 2015-05-26 LAB — POCT INR: INR: 2.8

## 2015-06-11 ENCOUNTER — Encounter: Payer: Self-pay | Admitting: Family Medicine

## 2015-06-11 ENCOUNTER — Ambulatory Visit (INDEPENDENT_AMBULATORY_CARE_PROVIDER_SITE_OTHER): Payer: Medicare Other | Admitting: Family Medicine

## 2015-06-11 VITALS — BP 136/60 | HR 74 | Temp 98.0°F | Resp 16 | Wt 104.0 lb

## 2015-06-11 DIAGNOSIS — I38 Endocarditis, valve unspecified: Secondary | ICD-10-CM

## 2015-06-11 DIAGNOSIS — R059 Cough, unspecified: Secondary | ICD-10-CM

## 2015-06-11 DIAGNOSIS — R05 Cough: Secondary | ICD-10-CM

## 2015-06-11 DIAGNOSIS — R5381 Other malaise: Secondary | ICD-10-CM | POA: Diagnosis not present

## 2015-06-11 LAB — POC INFLUENZA A&B (BINAX/QUICKVUE)
Influenza A, POC: NEGATIVE
Influenza B, POC: NEGATIVE

## 2015-06-11 MED ORDER — AMOXICILLIN 500 MG PO CAPS: 500.0000 mg | ORAL_CAPSULE | Freq: Three times a day (TID) | ORAL | Status: DC

## 2015-06-11 NOTE — Progress Notes (Signed)
Patient ID: Kathy Mccall, female   DOB: [birth year], 82 y.o.   MRN: [id number]   Patient: Kathy Mccall Female    DOB: 02/17/1924   82 y.o.   MRN: [id number] Visit Date: 06/11/2015  Today's Provider: Vernie Murders, PA   Chief Complaint  Patient presents with  . URI   Subjective:    URI  This is a new problem. Episode onset: four days ago. The problem has been unchanged. Associated symptoms include congestion and coughing. Associated symptoms comments: Night sweats, low grade fever. She has tried nothing for the symptoms.   Past Medical History  Diagnosis Date  . Arthritis   . Atrial fibrillation ([place])   . Congestive heart failure ([place])   . S/P aortic valve replacement     st[facility] jude  . Rheumatic fever    Past Surgical History  Procedure Laterality Date  . Aortic valve replacement     Family History  Problem Relation Age of Onset  . Family history unknown: Yes     Previous Medications   CALCIUM-VITAMIN D (OSCAL 500/200 D-3) 500-200 MG-UNIT PER TABLET    Take 1 tablet by mouth daily.     DAILY MULTIPLE VITAMINS TABLET    Take 1 tablet by mouth daily.     FUROSEMIDE (LASIX) 20 MG TABLET    TAKE ONE TABLET BY MOUTH ONCE DAILY   HYDROCODONE-ACETAMINOPHEN (NORCO/VICODIN) 5-325 MG PER TABLET    1/2 to 1 tablet twice a day as needed   IRON POLYSACCHARIDES (FERREX 150) 150 MG CAPSULE    Take 150 mg by mouth daily.    KLOR-CON M20 20 MEQ TABLET    TAKE ONE TABLET BY MOUTH ONCE DAILY   LATANOPROST (XALATAN) 0.005 % OPHTHALMIC SOLUTION    1 drop at bedtime.     LEVOTHYROXINE (SYNTHROID, LEVOTHROID) 50 MCG TABLET    Take 50 mcg by mouth daily.     LISINOPRIL (PRINIVIL,ZESTRIL) 10 MG TABLET    TAKE ONE TABLET BY MOUTH ONCE DAILY   MULTIPLE VITAMIN PO    Take 1 tablet by mouth daily.   OMEPRAZOLE (PRILOSEC) 20 MG CAPSULE    TAKE ONE CAPSULE BY MOUTH ONCE DAILY   PRAVASTATIN (PRAVACHOL) 20 MG TABLET    TAKE ONE TABLET BY MOUTH ONCE DAILY   TIMOLOL (TIMOPTIC) 0.5 % OPHTHALMIC  SOLUTION    1 drop.     WARFARIN (COUMADIN) 3 MG TABLET    TAKE ONE TABLET BY MOUTH ONCE DAILY   Allergies  Allergen Reactions  . Sulfa Antibiotics   . Sulfonamide Derivatives     Review of Systems  Constitutional: Negative.   HENT: Positive for congestion.   Eyes: Negative.   Respiratory: Positive for cough.   Cardiovascular: Negative.   Gastrointestinal: Negative.   Endocrine: Negative.   Genitourinary: Negative.   Musculoskeletal: Negative.   Skin: Negative.   Allergic/Immunologic: Negative.   Neurological: Negative.   Hematological: Negative.   Psychiatric/Behavioral: Negative.     Social History  Substance Use Topics  . Smoking status: Never Smoker   . Smokeless tobacco: Not on file     Comment: tobacco use- no   . Alcohol Use: No   Objective:   BP 136/60 mmHg  Pulse 74  Temp(Src) 98 F (36.7 C) (Oral)  Resp 16  Wt 104 lb (47.174 kg)  Physical Exam  Constitutional: She is oriented to person, place, and time. She appears well-developed and well-nourished. No distress.  HENT:  Head: Normocephalic and atraumatic.  Right Ear: Hearing and external ear normal.  Left Ear: Hearing and external ear normal.  Nose: Nose normal.  Dull reflex to TM's with questionable fluid line on the right. Hearing severely diminished and wears hearing aids bilaterally. Edentulous compensated.   Eyes: Conjunctivae, EOM and lids are normal. Right eye exhibits no discharge. Left eye exhibits no discharge. No scleral icterus.  Neck: Normal range of motion. Neck supple.  Cardiovascular:  Slightly irregular rhythm. Loud prosthetic aortic valve.  Pulmonary/Chest: Effort normal and breath sounds normal. No respiratory distress.  Abdominal: Soft. Bowel sounds are normal.  Neurological: She is alert and oriented to person, place, and time.  Skin: Skin is intact. No lesion and no rash noted.  Psychiatric: She has a normal mood and affect. Her speech is normal and behavior is normal. Thought  content normal.      Assessment & Plan:     1. Cough Onset over the past 4 days with low grade fever occasionally. No signs of pneumonia. Suspect URI with possible early bronchitis. Flu test negative. May use Mucinex-DM and add Amoxil. Increase fluid intake and recheck in 5-7 days if no better. - amoxicillin (AMOXIL) 500 MG capsule; Take 1 capsule (500 mg total) by mouth 3 (three) times daily.  Dispense: 30 capsule; Refill: 0 - POC Influenza A&B  2. Malaise Negative influenza test. Increase fluid intake and may use Tylenol prn aches, pains or fever. - POC Influenza A&B  3. Heart valve disease History of aortic valve replacement and on anticoagulant therapy. Followed by Dr. Rockey Situ.

## 2015-06-11 NOTE — Patient Instructions (Signed)
Upper Respiratory Infection, Adult Most upper respiratory infections (URIs) are a viral infection of the air passages leading to the lungs. A URI affects the nose, throat, and upper air passages. The most common type of URI is nasopharyngitis and is typically referred to as "the common cold." URIs run their course and usually go away on their own. Most of the time, a URI does not require medical attention, but sometimes a bacterial infection in the upper airways can follow a viral infection. This is called a secondary infection. Sinus and middle ear infections are common types of secondary upper respiratory infections. Bacterial pneumonia can also complicate a URI. A URI can worsen asthma and chronic obstructive pulmonary disease (COPD). Sometimes, these complications can require emergency medical care and may be life threatening.  CAUSES Almost all URIs are caused by viruses. A virus is a type of germ and can spread from one person to another.  RISKS FACTORS You may be at risk for a URI if:   You smoke.   You have chronic heart or lung disease.  You have a weakened defense (immune) system.   You are very young or very old.   You have nasal allergies or asthma.  You work in crowded or poorly ventilated areas.  You work in health care facilities or schools. SIGNS AND SYMPTOMS  Symptoms typically develop 2-3 days after you come in contact with a cold virus. Most viral URIs last 7-10 days. However, viral URIs from the influenza virus (flu virus) can last 14-18 days and are typically more severe. Symptoms may include:   Runny or stuffy (congested) nose.   Sneezing.   Cough.   Sore throat.   Headache.   Fatigue.   Fever.   Loss of appetite.   Pain in your forehead, behind your eyes, and over your cheekbones (sinus pain).  Muscle aches.  DIAGNOSIS  Your health care provider may diagnose a URI by:  Physical exam.  Tests to check that your symptoms are not due to  another condition such as:  Strep throat.  Sinusitis.  Pneumonia.  Asthma. TREATMENT  A URI goes away on its own with time. It cannot be cured with medicines, but medicines may be prescribed or recommended to relieve symptoms. Medicines may help:  Reduce your fever.  Reduce your cough.  Relieve nasal congestion. HOME CARE INSTRUCTIONS   Take medicines only as directed by your health care provider.   Gargle warm saltwater or take cough drops to comfort your throat as directed by your health care provider.  Use a warm mist humidifier or inhale steam from a shower to increase air moisture. This may make it easier to breathe.  Drink enough fluid to keep your urine clear or pale yellow.   Eat soups and other clear broths and maintain good nutrition.   Rest as needed.   Return to work when your temperature has returned to normal or as your health care provider advises. You may need to stay home longer to avoid infecting others. You can also use a face mask and careful hand washing to prevent spread of the virus.  Increase the usage of your inhaler if you have asthma.   Do not use any tobacco products, including cigarettes, chewing tobacco, or electronic cigarettes. If you need help quitting, ask your health care provider. PREVENTION  The best way to protect yourself from getting a cold is to practice good hygiene.   Avoid oral or hand contact with people with cold   symptoms.   Wash your hands often if contact occurs.  There is no clear evidence that vitamin C, vitamin E, echinacea, or exercise reduces the chance of developing a cold. However, it is always recommended to get plenty of rest, exercise, and practice good nutrition.  SEEK MEDICAL CARE IF:   You are getting worse rather than better.   Your symptoms are not controlled by medicine.   You have chills.  You have worsening shortness of breath.  You have brown or red mucus.  You have yellow or brown nasal  discharge.  You have pain in your face, especially when you bend forward.  You have a fever.  You have swollen neck glands.  You have pain while swallowing.  You have white areas in the back of your throat. SEEK IMMEDIATE MEDICAL CARE IF:   You have severe or persistent:  Headache.  Ear pain.  Sinus pain.  Chest pain.  You have chronic lung disease and any of the following:  Wheezing.  Prolonged cough.  Coughing up blood.  A change in your usual mucus.  You have a stiff neck.  You have changes in your:  Vision.  Hearing.  Thinking.  Mood. MAKE SURE YOU:   Understand these instructions.  Will watch your condition.  Will get help right away if you are not doing well or get worse.   This information is not intended to replace advice given to you by your health care provider. Make sure you discuss any questions you have with your health care provider.   Document Released: 10/04/2000 Document Revised: 08/25/2014 Document Reviewed: 07/16/2013 Elsevier Interactive Patient Education 2016 [employer] Inc[employer]  

## 2015-06-24 ENCOUNTER — Other Ambulatory Visit: Payer: Self-pay | Admitting: Family Medicine

## 2015-06-24 MED ORDER — HYDROCODONE-ACETAMINOPHEN 5-325 MG PO TABS: ORAL_TABLET | ORAL | Status: DC

## 2015-06-24 NOTE — Telephone Encounter (Signed)
Do you want this refilled?

## 2015-06-24 NOTE — Telephone Encounter (Signed)
1 rf.   Appt after flu season

## 2015-06-24 NOTE — Telephone Encounter (Signed)
Rx printed. Pt already has appt on march 14th-aa

## 2015-06-24 NOTE — Telephone Encounter (Signed)
Pt contacted office for refill request on the following medications: HYDROcodone-acetaminophen (NORCO/VICODIN) 5-325 MG per tablet for arthritis. Thanks TNP

## 2015-06-27 ENCOUNTER — Other Ambulatory Visit: Payer: Self-pay | Admitting: Family Medicine

## 2015-07-05 DIAGNOSIS — L821 Other seborrheic keratosis: Secondary | ICD-10-CM

## 2015-07-06 ENCOUNTER — Ambulatory Visit (INDEPENDENT_AMBULATORY_CARE_PROVIDER_SITE_OTHER): Payer: Medicare Other | Admitting: Family Medicine

## 2015-07-06 ENCOUNTER — Encounter: Payer: Self-pay | Admitting: Family Medicine

## 2015-07-06 VITALS — BP 152/72 | HR 82 | Temp 98.0°F | Resp 14 | Ht 62.25 in | Wt 102.0 lb

## 2015-07-06 DIAGNOSIS — Z0000 Encounter for general adult medical examination without abnormal findings: Secondary | ICD-10-CM | POA: Diagnosis not present

## 2015-07-06 NOTE — Progress Notes (Signed)
Patient ID: Kathy Mccall, female   DOB: 15-Aug-1923, 82 y.o.   MRN: [id number]  Visit Date: 07/06/2015  Today's Provider: Wilhemena Durie, MD   Chief Complaint  Patient presents with  . Medicare Wellness   Subjective:   Kathy Mccall is a 82 y.o. female who presents today for her Subsequent Annual Wellness Visit. She feels fairly well. She reports exercising not much but does house work. She reports she is sleeping well. No complaints. Immunization History  Administered Date(s) Administered  . Influenza, High Dose Seasonal PF 01/04/2015  . Pneumococcal Conjugate-13 12/23/2013  . Td 09/22/1993   Pneumovax 02/2004   Last  Mammogram 02/03/14 normal-patient states she had mammogram in 2016 it was normal but i cant find a report  BMD 08/28/12 osteopenia and some osteoporosis  Endoscopy 01/09/08 normal  Colonoscopy 01/09/08 diverticulosis, hemorrhoids  Pap smear 10/30/06 normal.    Review of Systems  Constitutional: Negative.   HENT: Negative.   Eyes: Negative.   Respiratory: Negative.   Cardiovascular: Negative.   Gastrointestinal: Negative.   Endocrine: Negative.   Genitourinary: Negative.   Musculoskeletal: Positive for arthralgias.  Skin: Negative.   Allergic/Immunologic: Negative.   Neurological: Negative.   Hematological: Negative.   Psychiatric/Behavioral: Negative.     Patient Active Problem List   Diagnosis Date Noted  . SK (seborrheic keratosis) 07/05/2015  . Encounter for anticoagulation discussion and counseling 03/08/2015  . Absolute anemia 01/04/2015  . Heart valve replaced 01/04/2015  . Arthritis 01/04/2015  . Atherosclerosis of coronary artery 01/04/2015  . Bilateral cataracts 01/04/2015  . DD (diverticular disease) 01/04/2015  . Bleeding from the nose 01/04/2015  . Essential (primary) hypertension 01/04/2015  . Glaucoma 01/04/2015  . Acid reflux 01/04/2015  . Borderline diabetes 01/04/2015  . H/O acute myocardial infarction 01/04/2015  . H/O  gastric ulcer 01/04/2015  . Hypothyroidism 01/04/2015  . Arthritis, degenerative 01/04/2015  . OP (osteoporosis) 01/04/2015  . Post menopausal syndrome 01/04/2015  . Basal cell papilloma 01/04/2015  . Heart valve disease 01/04/2015  . Osteoarthritis 10/16/2014  . Current use of long term anticoagulation 04/25/2012  . Hyperlipidemia 01/14/2010  . HYPERTENSION, BENIGN 01/14/2010  . CAD, NATIVE VESSEL 01/14/2010  . Atrial fibrillation ([place]) 01/14/2010  . HEART VALVE REPLACEMENT, HX OF 01/14/2010  . Hypercholesteremia 12/11/2006  . Chest pain, precordial 12/11/2006    Social History   Social History  . Marital Status: Widowed    Spouse Name: N/A  . Number of Children: N/A  . Years of Education: N/A   Occupational History  . Not on file.   Social History Main Topics  . Smoking status: Never Smoker   . Smokeless tobacco: Never Used     Comment: tobacco use- no   . Alcohol Use: No  . Drug Use: No  . Sexual Activity: No   Other Topics Concern  . Not on file   Social History Narrative   Retired. Regularly exercises.     Past Surgical History  Procedure Laterality Date  . Aortic valve replacement    . Upper gi endoscopy      01/09/08 normal esophagus, normal duodenum, non-bleeding erosive gastropathy    Her family history includes Arthritis in her brother and father; Cancer in her brother; Diabetes in her sister; Heart attack in her brother, father, and sister; Heart disease in her mother; Hypertension in her mother.    Outpatient Encounter Prescriptions as of 07/06/2015  Medication Sig Note  . calcium-vitamin D (OSCAL 500/200 D-3) 500-200 MG-UNIT  per tablet Take 1 tablet by mouth daily.     [name] DAILY MULTIPLE VITAMINS tablet Take 1 tablet by mouth daily.     [name] FERREX 150 150 MG capsule TAKE ONE CAPSULE BY MOUTH ONCE DAILY   . furosemide (LASIX) 20 MG tablet TAKE ONE TABLET BY MOUTH ONCE DAILY   . HYDROcodone-acetaminophen (NORCO/VICODIN) 5-325 MG tablet 1/2 to 1 tablet  twice a day as needed   . KLOR-CON M20 20 MEQ tablet TAKE ONE TABLET BY MOUTH ONCE DAILY   . latanoprost (XALATAN) 0.005 % ophthalmic solution 1 drop at bedtime.     [name] levothyroxine (SYNTHROID, LEVOTHROID) 50 MCG tablet Take 50 mcg by mouth daily.     [name] lisinopril (PRINIVIL,ZESTRIL) 10 MG tablet TAKE ONE TABLET BY MOUTH ONCE DAILY   . omeprazole (PRILOSEC) 20 MG capsule TAKE ONE CAPSULE BY MOUTH ONCE DAILY   . pravastatin (PRAVACHOL) 20 MG tablet TAKE ONE TABLET BY MOUTH ONCE DAILY   . timolol (TIMOPTIC) 0.5 % ophthalmic solution 1 drop.     [name] warfarin (COUMADIN) 3 MG tablet TAKE ONE TABLET BY MOUTH ONCE DAILY   . [DISCONTINUED] amoxicillin (AMOXIL) 500 MG capsule Take 1 capsule (500 mg total) by mouth 3 (three) times daily.   . [DISCONTINUED] MULTIPLE VITAMIN PO Take 1 tablet by mouth daily. 01/04/2015: Received from: [facility]: Take by mouth.   No facility-administered encounter medications on file as of 07/06/2015.    Allergies  Allergen Reactions  . Sulfa Antibiotics   . Sulfonamide Derivatives     Patient Care Team: Jerrol Banana., MD as PCP - General (Unknown Physician Specialty)  Objective:   Vitals:  Filed Vitals:   07/06/15 1006  BP: 152/72  Pulse: 82  Temp: 98 F (36.7 C)  Resp: 14  Height: 5' 2.25" (1.581 m)  Weight: 102 lb (46.267 kg)    Physical Exam  Constitutional: She is oriented to person, place, and time. She appears well-developed and well-nourished.  Cachectic white female in no acute distress.  HENT:  Head: Normocephalic and atraumatic.  Right Ear: External ear normal.  Left Ear: External ear normal.  Nose: Nose normal.  Eyes: Conjunctivae are normal.  Neck: Neck supple.  Cardiovascular: Normal rate, regular rhythm and normal heart sounds.   Pulmonary/Chest: Effort normal and breath sounds normal.  Abdominal: Soft.  Neurological: She is alert and oriented to person, place, and time.  Skin: Skin is warm and  dry.  Psychiatric: She has a normal mood and affect. Her behavior is normal. Judgment and thought content normal.    Activities of Daily Living In your present state of health, do you have any difficulty performing the following activities: 07/06/2015  Hearing? Y  Vision? N  Difficulty concentrating or making decisions? Y  Walking or climbing stairs? Y  Dressing or bathing? N  Doing errands, shopping? N    Fall Risk Assessment Fall Risk  07/06/2015 01/04/2015  Falls in the past year? No No     Depression Screen PHQ 2/9 Scores 07/06/2015 01/04/2015  PHQ - 2 Score 0 0    Cognitive Testing - 6-CIT    Year: 0 4 points  Month: 0 3 points  Memorize "Pia Mau, [address], [place]"  Time (within 1 hour:) 0 3 points  Count backwards from 20: 0 2 4 points  Name months of year: 0 2 4 points-4  Repeat Address: 0 2 4 6 8 10  points-6   Total Score: 10/28  Interpretation : Normal (0-7) Abnormal (8-28)  Audit-C Alcohol Use Screening  Question Answer Points  How often do you have alcoholic drink? never 0  On days you do drink alcohol, how many drinks do you typically consume? never 0  How oftey will you drink 6 or more in a total? never 0  Total Score:  0   A score of 3 or more in women, and 4 or more in men indicates increased risk for alcohol abuse, EXCEPT if all of the points are from question 1.     Assessment & Plan:     Annual Wellness Visit  Reviewed patient's Family Medical History Reviewed and updated list of patient's medical providers Assessment of cognitive impairment was done Assessed patient's functional ability Established a written schedule for health screening [place] Completed and Reviewed             No DRE in 82 yo. Caloric Malnutrition I have done the exam and reviewed the above chart and it is accurate to the best of my knowledge.     Miguel Aschoff MD [facility] 07/06/2015 10:08 AM  ------------------------------------------------------------------------------------------------------------

## 2015-07-07 ENCOUNTER — Ambulatory Visit (INDEPENDENT_AMBULATORY_CARE_PROVIDER_SITE_OTHER): Payer: Medicare Other

## 2015-07-07 DIAGNOSIS — Z7901 Long term (current) use of anticoagulants: Secondary | ICD-10-CM | POA: Diagnosis not present

## 2015-07-07 DIAGNOSIS — I4891 Unspecified atrial fibrillation: Secondary | ICD-10-CM

## 2015-07-07 DIAGNOSIS — Z9889 Other specified postprocedural states: Secondary | ICD-10-CM | POA: Diagnosis not present

## 2015-07-07 LAB — POCT INR: INR: 2.8

## 2015-07-13 DIAGNOSIS — Z961 Presence of intraocular lens: Secondary | ICD-10-CM | POA: Diagnosis not present

## 2015-07-13 DIAGNOSIS — H40153 Residual stage of open-angle glaucoma, bilateral: Secondary | ICD-10-CM | POA: Diagnosis not present

## 2015-07-26 ENCOUNTER — Other Ambulatory Visit: Payer: Self-pay | Admitting: Cardiovascular Disease

## 2015-08-18 ENCOUNTER — Ambulatory Visit (INDEPENDENT_AMBULATORY_CARE_PROVIDER_SITE_OTHER): Payer: Medicare Other

## 2015-08-18 DIAGNOSIS — I4891 Unspecified atrial fibrillation: Secondary | ICD-10-CM

## 2015-08-18 DIAGNOSIS — Z7901 Long term (current) use of anticoagulants: Secondary | ICD-10-CM

## 2015-08-18 DIAGNOSIS — Z9889 Other specified postprocedural states: Secondary | ICD-10-CM

## 2015-08-18 LAB — POCT INR: INR: 2.5

## 2015-09-13 ENCOUNTER — Other Ambulatory Visit: Payer: Self-pay | Admitting: Family Medicine

## 2015-09-14 ENCOUNTER — Encounter: Payer: Self-pay | Admitting: Cardiovascular Disease

## 2015-09-14 ENCOUNTER — Ambulatory Visit (INDEPENDENT_AMBULATORY_CARE_PROVIDER_SITE_OTHER): Payer: Medicare Other | Admitting: Cardiovascular Disease

## 2015-09-14 VITALS — BP 148/70 | HR 95 | Ht 62.0 in | Wt 102.2 lb

## 2015-09-14 DIAGNOSIS — Z9889 Other specified postprocedural states: Secondary | ICD-10-CM

## 2015-09-14 DIAGNOSIS — I4891 Unspecified atrial fibrillation: Secondary | ICD-10-CM

## 2015-09-14 DIAGNOSIS — I2510 Atherosclerotic heart disease of native coronary artery without angina pectoris: Secondary | ICD-10-CM | POA: Diagnosis not present

## 2015-09-14 DIAGNOSIS — I10 Essential (primary) hypertension: Secondary | ICD-10-CM | POA: Diagnosis not present

## 2015-09-14 NOTE — Assessment & Plan Note (Signed)
Persistent atrial fibrillation On anticoagulation, denies any symptoms No medication changes made

## 2015-09-14 NOTE — Assessment & Plan Note (Signed)
Currently with no symptoms of angina. No further workup at this time. Continue current medication regimen. 

## 2015-09-14 NOTE — Assessment & Plan Note (Signed)
Asymptomatic on today's visit with no shortness of breath or leg edema. Recommended repeat echocardiogram in one year

## 2015-09-14 NOTE — Patient Instructions (Signed)
You are doing well. No medication changes were made.  Please call us if you have new issues that need to be addressed before your next appt.  Your physician wants you to follow-up in: 6 months.  You will receive a reminder letter in the mail two months in advance. If you don't receive a letter, please call our office to schedule the follow-up appointment.   

## 2015-09-14 NOTE — Progress Notes (Signed)
Patient ID: Kathy Mccall, female    DOB: Jul 31, 1923, 82 y.o.   MRN: [id number]  HPI Comments: [name] is a very pleasant 82 year old woman with a history of rheumatic valve disease, St. Jude aortic valve replacement in 1991, chronic atrial fibrillation on warfarin,  severe tricuspid regurgitation on echocardiogram dating back to 2008, previous history of mild to moderate pulmonary hypertension (right ventricular systolic pressure estimated at 46), who presents for routine followup of her atrial fibrillation and prosthetic valve  .  She has been stable on Lasix daily.  In follow-up today, she reports that she lost her daughter to cancer last week. Colon cancer with metastases to the lung. Sleep has been poor, appetite poor, losing weight. Otherwise feels well, no significant shortness of breath on exertion, no leg edema  Blood pressure elevated today but she reports that she did not sleep well last night Walks with a cane, no recent falls Blood pressure well controlled at home Denies any significant chest pain on exertion  EKG on today's visit shows atrial fibrillation with ventricular rate 74 bpm, no significant ST or T-wave changes, left axis deviation  Other past medical history  echo was in early 2014 . Normal ejection fraction, severe TR, well-seated aortic valve .  No falls. No complications on warfarin  Cardiac catheterization in August 2008 showed mild to moderate distal left main disease, moderate ostial left circumflex disease, mild ostial LAD and ostial B1 disease, severe disease of the PL range medical management was recommended.   In the past, she has had difficulty tolerating Lipitor, Now on low-dose pravastatin Total cholesterol 166, LDL 85, HDL 65     Allergies  Allergen Reactions  . Sulfa Antibiotics   . Sulfonamide Derivatives     Current Outpatient Prescriptions on File Prior to Visit  Medication Sig Dispense Refill  . calcium-vitamin D (OSCAL 500/200  D-3) 500-200 MG-UNIT per tablet Take 1 tablet by mouth daily.      [name] DAILY MULTIPLE VITAMINS tablet Take 1 tablet by mouth daily.      [name] FERREX 150 150 MG capsule TAKE ONE CAPSULE BY MOUTH ONCE DAILY 30 capsule 12  . furosemide (LASIX) 20 MG tablet TAKE ONE TABLET BY MOUTH ONCE DAILY 30 tablet 6  . HYDROcodone-acetaminophen (NORCO/VICODIN) 5-325 MG tablet 1/2 to 1 tablet twice a day as needed 60 tablet 0  . KLOR-CON M20 20 MEQ tablet TAKE ONE TABLET BY MOUTH ONCE DAILY 30 tablet 6  . latanoprost (XALATAN) 0.005 % ophthalmic solution 1 drop at bedtime.      [name] levothyroxine (SYNTHROID, LEVOTHROID) 50 MCG tablet TAKE ONE TABLET BY MOUTH ONCE DAILY 30 tablet 6  . lisinopril (PRINIVIL,ZESTRIL) 10 MG tablet TAKE ONE TABLET BY MOUTH ONCE DAILY 90 tablet 3  . omeprazole (PRILOSEC) 20 MG capsule TAKE ONE CAPSULE BY MOUTH ONCE DAILY 30 capsule 12  . pravastatin (PRAVACHOL) 20 MG tablet TAKE ONE TABLET BY MOUTH ONCE DAILY 30 tablet 12  . timolol (TIMOPTIC) 0.5 % ophthalmic solution 1 drop.      [name] warfarin (COUMADIN) 3 MG tablet TAKE ONE TABLET BY MOUTH ONCE DAILY 30 tablet 12   No current facility-administered medications on file prior to visit.    Past Medical History  Diagnosis Date  . Arthritis   . Atrial fibrillation ([place])   . Congestive heart failure ([place])   . S/P aortic valve replacement     st. jude  . Rheumatic fever     Past Surgical History  Procedure Laterality Date  . Aortic valve replacement    . Upper gi endoscopy      01/09/08 normal esophagus, normal duodenum, non-bleeding erosive gastropathy    Social History  reports that she has never smoked. She has never used smokeless tobacco. She reports that she does not drink alcohol or use illicit drugs.  Family History family history includes Arthritis in her brother and father; Cancer in her brother; Diabetes in her sister; Heart attack in her brother, father, and sister; Heart disease in her mother; Hypertension in her  mother.  Review of Systems  Constitutional: Negative.   Eyes: Negative.   Respiratory: Negative.   Cardiovascular: Negative.   Gastrointestinal: Negative.   Musculoskeletal: Positive for gait problem.  Neurological: Negative.   Hematological: Negative.   Psychiatric/Behavioral: Negative.   All other systems reviewed and are negative.   BP 148/70 mmHg  Pulse 95  Ht 5\' 2"  (1.575 m)  Wt 102 lb 4 oz (46.38 kg)  BMI 18.70 kg/m2  Physical Exam  Constitutional: She is oriented to person, place, and time. She appears well-developed and well-nourished.  HENT:  Head: Normocephalic.  Nose: Nose normal.  Mouth/Throat: Oropharynx is clear and moist.  Eyes: Conjunctivae are normal. Pupils are equal, round, and reactive to light.  Neck: Normal range of motion. Neck supple. No JVD present.  Cardiovascular: Normal rate, S1 normal, S2 normal and intact distal pulses.  An irregularly irregular rhythm present. Exam reveals no gallop and no friction rub.   Murmur heard.  Systolic murmur is present with a grade of 2/6  Pulmonary/Chest: Effort normal and breath sounds normal. No respiratory distress. She has no wheezes. She has no rales. She exhibits no tenderness.  Abdominal: Soft. Bowel sounds are normal. She exhibits no distension. There is no tenderness.  Musculoskeletal: Normal range of motion. She exhibits no edema or tenderness.  Lymphadenopathy:    She has no cervical adenopathy.  Neurological: She is alert and oriented to person, place, and time. Coordination normal.  Skin: Skin is warm and dry. No rash noted. No erythema.  Psychiatric: She has a normal mood and affect. Her behavior is normal. Judgment and thought content normal.    Assessment and Plan  Nursing note and vitals reviewed.

## 2015-09-14 NOTE — Assessment & Plan Note (Signed)
Blood pressure borderline elevated likely secondary to stress from losing her daughter No medication changes made

## 2015-09-29 ENCOUNTER — Ambulatory Visit (INDEPENDENT_AMBULATORY_CARE_PROVIDER_SITE_OTHER): Payer: Medicare Other

## 2015-09-29 DIAGNOSIS — Z9889 Other specified postprocedural states: Secondary | ICD-10-CM

## 2015-09-29 DIAGNOSIS — I4891 Unspecified atrial fibrillation: Secondary | ICD-10-CM

## 2015-09-29 DIAGNOSIS — Z7901 Long term (current) use of anticoagulants: Secondary | ICD-10-CM | POA: Diagnosis not present

## 2015-09-29 LAB — POCT INR: INR: 2.4

## 2015-10-25 ENCOUNTER — Other Ambulatory Visit: Payer: Self-pay | Admitting: Family Medicine

## 2015-10-25 NOTE — Telephone Encounter (Signed)
Pt contacted office for refill request on the following medications:  HYDROcodone-acetaminophen (NORCO/VICODIN) 5-325 MG tablet.  CB#[phone]

## 2015-10-27 MED ORDER — HYDROCODONE-ACETAMINOPHEN 5-325 MG PO TABS: ORAL_TABLET | ORAL | Status: DC

## 2015-10-27 NOTE — Telephone Encounter (Signed)
Ok--needs appt for further rf.

## 2015-10-27 NOTE — Telephone Encounter (Signed)
Pt informed rx ready for pick up

## 2015-11-08 ENCOUNTER — Other Ambulatory Visit: Payer: Self-pay | Admitting: Cardiovascular Disease

## 2015-11-08 DIAGNOSIS — I2510 Atherosclerotic heart disease of native coronary artery without angina pectoris: Secondary | ICD-10-CM

## 2015-11-08 DIAGNOSIS — I6523 Occlusion and stenosis of bilateral carotid arteries: Secondary | ICD-10-CM

## 2015-11-08 DIAGNOSIS — I4891 Unspecified atrial fibrillation: Secondary | ICD-10-CM

## 2015-11-10 ENCOUNTER — Ambulatory Visit (INDEPENDENT_AMBULATORY_CARE_PROVIDER_SITE_OTHER): Payer: Medicare Other

## 2015-11-10 DIAGNOSIS — I4891 Unspecified atrial fibrillation: Secondary | ICD-10-CM | POA: Diagnosis not present

## 2015-11-10 DIAGNOSIS — Z9889 Other specified postprocedural states: Secondary | ICD-10-CM | POA: Diagnosis not present

## 2015-11-10 DIAGNOSIS — Z7901 Long term (current) use of anticoagulants: Secondary | ICD-10-CM

## 2015-11-10 LAB — POCT INR: INR: 2.4

## 2015-11-11 DIAGNOSIS — H903 Sensorineural hearing loss, bilateral: Secondary | ICD-10-CM | POA: Diagnosis not present

## 2015-11-11 DIAGNOSIS — H60332 Swimmer's ear, left ear: Secondary | ICD-10-CM | POA: Diagnosis not present

## 2015-11-15 DIAGNOSIS — H60332 Swimmer's ear, left ear: Secondary | ICD-10-CM | POA: Diagnosis not present

## 2015-11-17 DIAGNOSIS — H40153 Residual stage of open-angle glaucoma, bilateral: Secondary | ICD-10-CM | POA: Diagnosis not present

## 2015-11-24 ENCOUNTER — Ambulatory Visit (INDEPENDENT_AMBULATORY_CARE_PROVIDER_SITE_OTHER): Payer: Medicare Other

## 2015-11-24 ENCOUNTER — Other Ambulatory Visit: Payer: Self-pay

## 2015-11-24 DIAGNOSIS — I2510 Atherosclerotic heart disease of native coronary artery without angina pectoris: Secondary | ICD-10-CM | POA: Diagnosis not present

## 2015-11-24 DIAGNOSIS — I4891 Unspecified atrial fibrillation: Secondary | ICD-10-CM

## 2015-11-25 ENCOUNTER — Telehealth: Payer: Self-pay | Admitting: Cardiovascular Disease

## 2015-11-25 NOTE — Telephone Encounter (Signed)
Granddaughter called to say Kathy Mccall received a call from [facility] but the patient cannot hear and didn't understand directions.  Granddaughter Leveda Anna would like for [place] to speak with her but she is not on dpr . Her mom whom is on dpr recently passed away.

## 2015-11-25 NOTE — Telephone Encounter (Signed)
I have not called pt.   I don't see any recent phone notes from our office.

## 2015-12-07 ENCOUNTER — Ambulatory Visit (INDEPENDENT_AMBULATORY_CARE_PROVIDER_SITE_OTHER): Payer: Medicare Other | Admitting: Cardiovascular Disease

## 2015-12-07 ENCOUNTER — Encounter: Payer: Self-pay | Admitting: Cardiovascular Disease

## 2015-12-07 VITALS — BP 130/80 | HR 83 | Ht 63.0 in | Wt 101.5 lb

## 2015-12-07 DIAGNOSIS — Z954 Presence of other heart-valve replacement: Secondary | ICD-10-CM

## 2015-12-07 DIAGNOSIS — R0602 Shortness of breath: Secondary | ICD-10-CM

## 2015-12-07 DIAGNOSIS — I4891 Unspecified atrial fibrillation: Secondary | ICD-10-CM

## 2015-12-07 DIAGNOSIS — I071 Rheumatic tricuspid insufficiency: Secondary | ICD-10-CM

## 2015-12-07 DIAGNOSIS — Z952 Presence of prosthetic heart valve: Secondary | ICD-10-CM

## 2015-12-07 DIAGNOSIS — I350 Nonrheumatic aortic (valve) stenosis: Secondary | ICD-10-CM | POA: Insufficient documentation

## 2015-12-07 NOTE — Patient Instructions (Signed)
Medication Instructions:   No medication changes  Labwork:  No new labs  Testing/Procedures:  Echo in one year for AVR, shortness of breath   Follow-Up: It was a pleasure seeing you in the office today. Please call us if you have new issues that need to be addressed before your next appt.  [phone]  Your physician wants you to follow-up in: 6 months.  You will receive a reminder letter in the mail two months in advance. If you don't receive a letter, please call our office to schedule the follow-up appointment.  If you need a refill on your cardiac medications before your next appointment, please call your pharmacy.

## 2015-12-07 NOTE — Progress Notes (Signed)
Cardiology Office Note  Date:  12/07/2015   ID:  Gypsie, Lybrook 1924/03/09, MRN [id number]  PCP:  Wilhemena Durie, MD   Chief Complaint  Patient presents with  . Other    Follow up from Echo. Meds reviewed by the patient verbally. Pt. c/o shortness of breath.     HPI:  Ms. Durand is a very pleasant 82 year old woman with a history of rheumatic valve disease, St. Jude aortic valve replacement in 1991, chronic atrial fibrillation on warfarin,  severe tricuspid regurgitation on echocardiogram dating back to 2008, previous history of mild to moderate pulmonary hypertension (right ventricular systolic pressure estimated at 46), who presents for routine followup of her atrial fibrillation and prosthetic valve  .  She has been stable on Lasix daily.  she lost her daughter to cancer ,  Colon cancer with metastases to the lung.  On her last clinic visit, Sleep was  poor, appetite poor, losing weight. In follow-up today, weight has stabilized, starting to eat more  Otherwise feels well,  no leg edema She has been spending more time with family, granddaughter reports she has noticed shortness of breath on exertion  Walks with a cane, no recent falls Blood pressure well controlled at home Denies any significant chest pain on exertion  Echocardiogram from August 2017 reviewed with her showing moderate aortic valve stenosis, severe tricuspid valve regurgitation, moderate MR  Other past medical history  echo was in early 2014 . Normal ejection fraction, severe TR, well-seated aortic valve .  No falls. No complications on warfarin  Cardiac catheterization in August 2008 showed mild to moderate distal left main disease, moderate ostial left circumflex disease, mild ostial LAD and ostial B1 disease, severe disease of the PL range medical management was recommended.   In the past, she has had difficulty tolerating Lipitor, Now on low-dose pravastatin Total cholesterol 166, LDL 85,  HDL 65  PMH:   has a past medical history of Arthritis; Atrial fibrillation ([place]); Congestive heart failure ([place]); Rheumatic fever; and S/P aortic valve replacement.  PSH:    Past Surgical History:  Procedure Laterality Date  . AORTIC VALVE REPLACEMENT    . UPPER GI ENDOSCOPY     01/09/08 normal esophagus, normal duodenum, non-bleeding erosive gastropathy    Current Outpatient Prescriptions  Medication Sig Dispense Refill  . calcium-vitamin D (OSCAL 500/200 D-3) 500-200 MG-UNIT per tablet Take 1 tablet by mouth daily.      [name] DAILY MULTIPLE VITAMINS tablet Take 1 tablet by mouth daily.      [name] FERREX 150 150 MG capsule TAKE ONE CAPSULE BY MOUTH ONCE DAILY 30 capsule 12  . furosemide (LASIX) 20 MG tablet TAKE ONE TABLET BY MOUTH ONCE DAILY 30 tablet 6  . HYDROcodone-acetaminophen (NORCO/VICODIN) 5-325 MG tablet 1/2 to 1 tablet twice a day as needed 60 tablet 0  . KLOR-CON M20 20 MEQ tablet TAKE ONE TABLET BY MOUTH ONCE DAILY 30 tablet 6  . latanoprost (XALATAN) 0.005 % ophthalmic solution 1 drop at bedtime.      [name] levothyroxine (SYNTHROID, LEVOTHROID) 50 MCG tablet TAKE ONE TABLET BY MOUTH ONCE DAILY 30 tablet 6  . lisinopril (PRINIVIL,ZESTRIL) 10 MG tablet TAKE ONE TABLET BY MOUTH ONCE DAILY 90 tablet 3  . omeprazole (PRILOSEC) 20 MG capsule TAKE ONE CAPSULE BY MOUTH ONCE DAILY 30 capsule 12  . pravastatin (PRAVACHOL) 20 MG tablet TAKE ONE TABLET BY MOUTH ONCE DAILY 30 tablet 12  . timolol (TIMOPTIC) 0.5 % ophthalmic solution 1  drop.      . warfarin (COUMADIN) 3 MG tablet TAKE ONE TABLET BY MOUTH ONCE DAILY 30 tablet 12   No current facility-administered medications for this visit.      Allergies:   Sulfa antibiotics and Sulfonamide derivatives   Social History:  The patient  reports that she has never smoked. She has never used smokeless tobacco. She reports that she does not drink alcohol or use drugs.   Family History:   family history includes Arthritis in her brother and father;  Cancer in her brother; Diabetes in her sister; Heart attack in her brother, father, and sister; Heart disease in her mother; Hypertension in her mother.    Review of Systems: Review of Systems  Constitutional: Negative.   Respiratory: Positive for shortness of breath.   Cardiovascular: Negative.   Gastrointestinal: Negative.   Musculoskeletal: Negative.   Neurological: Negative.   Psychiatric/Behavioral: Negative.   All other systems reviewed and are negative.    PHYSICAL EXAM: VS:  BP 130/80 (BP Location: Left Arm, Patient Position: Sitting, Cuff Size: Normal)   Pulse 83   Ht 5\' 3"  (1.6 m)   Wt 101 lb 8 oz (46 kg)   BMI 17.98 kg/m  , BMI Body mass index is 17.98 kg/m. GEN: Well nourished, well developed, in no acute distress  HEENT: normal  Neck: no JVD, carotid bruits, or masses Cardiac: RRR; 2+ SEM RSB,  no rubs, or gallops,no edema  Respiratory:  clear to auscultation bilaterally, normal work of breathing GI: soft, nontender, nondistended, + BS MS: no deformity or atrophy  Skin: warm and dry, no rash Neuro:  Strength and sensation are intact Psych: euthymic mood, full affect    Recent Labs: 01/04/2015: ALT 16; BUN 13; Creatinine, Ser 0.66; Platelets 199; Potassium 4.5; Sodium 138; TSH 1.540    Lipid Panel Lab Results  Component Value Date   CHOL 144 01/04/2015   HDL 57 01/04/2015   LDLCALC 72 01/04/2015   TRIG 77 01/04/2015      Wt Readings from Last 3 Encounters:  12/07/15 101 lb 8 oz (46 kg)  09/14/15 102 lb 4 oz (46.4 kg)  07/06/15 102 lb (46.3 kg)       ASSESSMENT AND PLAN:  S/P AVR (aortic valve replacement) - Plan: ECHOCARDIOGRAM COMPLETE  recent echocardiogram reviewed with her in detail Moderate aortic valve stenosis, prosthetic valve is more than 81 years old She reports she is relatively asymptomatic Repeat echocardiogram in one year  Atrial fibrillation with RVR ([place]) - Plan: ECHOCARDIOGRAM COMPLETE Rate relatively well controlled,  tolerating anticoagulation  SOB (shortness of breath) - Plan: ECHOCARDIOGRAM COMPLETE She denies significant shortness of breath. Family reports mild shortness of breath when she exerts herself. Recommended she stay on Lasix daily Lab work with primary care next month  Tricuspid valve regurgitation Severe tricuspid valve regurgitation per the echo She is not interested in any surgical options and as she is not very symptomatic, I do not think this would be of benefit  Aortic valve stenosis Moderate stenosis of prosthetic valve, recommend echo in one year   Total encounter time more than 25 minutes  Greater than 50% was spent in counseling and coordination of care with the patient   Disposition:   F/U  6 months   Orders Placed This Encounter  Procedures  . ECHOCARDIOGRAM COMPLETE     Signed, Esmond Plants, M.D., Ph.D. 12/07/2015  [place], [place]

## 2015-12-20 DIAGNOSIS — H40153 Residual stage of open-angle glaucoma, bilateral: Secondary | ICD-10-CM | POA: Diagnosis not present

## 2015-12-22 ENCOUNTER — Ambulatory Visit (INDEPENDENT_AMBULATORY_CARE_PROVIDER_SITE_OTHER): Payer: Medicare Other | Admitting: *Deleted

## 2015-12-22 DIAGNOSIS — Z9889 Other specified postprocedural states: Secondary | ICD-10-CM | POA: Diagnosis not present

## 2015-12-22 DIAGNOSIS — I4891 Unspecified atrial fibrillation: Secondary | ICD-10-CM

## 2015-12-22 DIAGNOSIS — Z7901 Long term (current) use of anticoagulants: Secondary | ICD-10-CM

## 2015-12-22 LAB — POCT INR: INR: 2.3

## 2016-01-10 ENCOUNTER — Encounter: Payer: Self-pay | Admitting: Family Medicine

## 2016-01-10 ENCOUNTER — Ambulatory Visit (INDEPENDENT_AMBULATORY_CARE_PROVIDER_SITE_OTHER): Payer: Medicare Other | Admitting: Family Medicine

## 2016-01-10 VITALS — BP 134/60 | HR 78 | Temp 97.8°F | Resp 16 | Wt 105.0 lb

## 2016-01-10 DIAGNOSIS — E785 Hyperlipidemia, unspecified: Secondary | ICD-10-CM

## 2016-01-10 DIAGNOSIS — I10 Essential (primary) hypertension: Secondary | ICD-10-CM

## 2016-01-10 DIAGNOSIS — Z23 Encounter for immunization: Secondary | ICD-10-CM | POA: Diagnosis not present

## 2016-01-10 DIAGNOSIS — R7303 Prediabetes: Secondary | ICD-10-CM | POA: Diagnosis not present

## 2016-01-10 DIAGNOSIS — E039 Hypothyroidism, unspecified: Secondary | ICD-10-CM | POA: Diagnosis not present

## 2016-01-10 DIAGNOSIS — H00013 Hordeolum externum right eye, unspecified eyelid: Secondary | ICD-10-CM | POA: Diagnosis not present

## 2016-01-10 NOTE — Progress Notes (Signed)
Kathy Mccall  MRN: [id number] DOB: 12/31/1923  Subjective:  HPI   The patient is an 82 year old female who presents today for follow up of her chronic issues.  She was last seen on July 06, 2015 for her annual wellness exam.  At that time she also had diagnosis of caloric malnutrition.  The patient's weight on her last exam was 102 and she is up to 105 today. Since her last visit the patient has lost one of her daughters in May to colon cancer.  She was 80 years old and was diagnosed 2 years ago.  There had not been anyone else I the family that had colon cancer.  The patient states that her appetite is just now coming back.  She had not been able to eat after her daughter's death.  When discussing depression with the patient she expressed that she has some days better than others but, does not feel at this time that she would need anything for depression or anxiety in relation to her daughters death.  The patient's last A1C was on 01/04/15 and was 6.0  She does not check her glucose at home.  Her other labs were all done in September 2016 also.    She is followed by cardiology and gets her INR done through his office.  She states she was last seen by him in August.  Patient Active Problem List   Diagnosis Date Noted  . Aortic valve stenosis 12/07/2015  . SK (seborrheic keratosis) 07/05/2015  . Encounter for anticoagulation discussion and counseling 03/08/2015  . Absolute anemia 01/04/2015  . Heart valve replaced 01/04/2015  . Arthritis 01/04/2015  . Atherosclerosis of coronary artery 01/04/2015  . Bilateral cataracts 01/04/2015  . DD (diverticular disease) 01/04/2015  . Bleeding from the nose 01/04/2015  . Essential (primary) hypertension 01/04/2015  . Glaucoma 01/04/2015  . Acid reflux 01/04/2015  . Borderline diabetes 01/04/2015  . H/O acute myocardial infarction 01/04/2015  . H/O gastric ulcer 01/04/2015  . Hypothyroidism 01/04/2015  . Arthritis, degenerative 01/04/2015    . OP (osteoporosis) 01/04/2015  . Post menopausal syndrome 01/04/2015  . Basal cell papilloma 01/04/2015  . Heart valve disease 01/04/2015  . Osteoarthritis 10/16/2014  . Current use of long term anticoagulation 04/25/2012  . Hyperlipidemia 01/14/2010  . HYPERTENSION, BENIGN 01/14/2010  . CAD, NATIVE VESSEL 01/14/2010  . Atrial fibrillation ([place]) 01/14/2010  . HEART VALVE REPLACEMENT, HX OF 01/14/2010  . Hypercholesteremia 12/11/2006  . Chest pain, precordial 12/11/2006    Past Medical History:  Diagnosis Date  . Arthritis   . Atrial fibrillation ([place])   . Congestive heart failure ([place])   . Rheumatic fever   . S/P aortic valve replacement    st. jude    Social History   Social History  . Marital status: Widowed    Spouse name: N/A  . Number of children: N/A  . Years of education: N/A   Occupational History  . Not on file.   Social History Main Topics  . Smoking status: Never Smoker  . Smokeless tobacco: Never Used     Comment: tobacco use- no   . Alcohol use No  . Drug use: No  . Sexual activity: No   Other Topics Concern  . Not on file   Social History Narrative   Retired. Regularly exercises.     Outpatient Encounter Prescriptions as of 01/10/2016  Medication Sig  . calcium-vitamin D (OSCAL 500/200 D-3) 500-200 MG-UNIT per tablet Take 1  tablet by mouth daily.    [name] DAILY MULTIPLE VITAMINS tablet Take 1 tablet by mouth daily.    [name] FERREX 150 150 MG capsule TAKE ONE CAPSULE BY MOUTH ONCE DAILY  . furosemide (LASIX) 20 MG tablet TAKE ONE TABLET BY MOUTH ONCE DAILY  . HYDROcodone-acetaminophen (NORCO/VICODIN) 5-325 MG tablet 1/2 to 1 tablet twice a day as needed  . KLOR-CON M20 20 MEQ tablet TAKE ONE TABLET BY MOUTH ONCE DAILY  . latanoprost (XALATAN) 0.005 % ophthalmic solution 1 drop at bedtime.    [name] levothyroxine (SYNTHROID, LEVOTHROID) 50 MCG tablet TAKE ONE TABLET BY MOUTH ONCE DAILY  . lisinopril (PRINIVIL,ZESTRIL) 10 MG tablet TAKE ONE TABLET BY MOUTH  ONCE DAILY  . omeprazole (PRILOSEC) 20 MG capsule TAKE ONE CAPSULE BY MOUTH ONCE DAILY  . pravastatin (PRAVACHOL) 20 MG tablet TAKE ONE TABLET BY MOUTH ONCE DAILY  . timolol (TIMOPTIC) 0.5 % ophthalmic solution 1 drop.    [name] warfarin (COUMADIN) 3 MG tablet TAKE ONE TABLET BY MOUTH ONCE DAILY   No facility-administered encounter medications on file as of 01/10/2016.     Allergies  Allergen Reactions  . Sulfa Antibiotics   . Sulfonamide Derivatives     Review of Systems  Constitutional: Negative.  Negative for fever and malaise/fatigue.  Eyes: Negative.   Respiratory: Negative.  Negative for cough, hemoptysis, sputum production, shortness of breath and wheezing.   Cardiovascular: Negative.  Negative for chest pain, palpitations, orthopnea, claudication, leg swelling and PND.  Gastrointestinal: Negative.   Musculoskeletal: Negative for falls.  Skin: Negative.   Neurological: Negative.  Negative for dizziness, weakness and headaches.  Endo/Heme/Allergies: Negative.   Psychiatric/Behavioral: Negative for depression, hallucinations and suicidal ideas. The patient has insomnia (chronic and unchged). The patient is not nervous/anxious.    Objective:  BP 134/60   Pulse 78   Temp 97.8 F (36.6 C) (Oral)   Resp 16   Wt 105 lb (47.6 kg)   BMI 18.60 kg/m   Physical Exam  Constitutional: She is oriented to person, place, and time and well-developed, well-nourished, and in no distress.  HENT:  Head: Normocephalic and atraumatic.  Eyes:  Large sty on the upper outer eyelid of the right eye.   Neck: Normal range of motion.  Cardiovascular: Normal rate and normal heart sounds.   III/VI systolic murmur  Pulmonary/Chest: Effort normal and breath sounds normal.  Neurological: She is alert and oriented to person, place, and time.  Skin: Skin is warm and dry.  Psychiatric: Mood, memory, affect and judgment normal.    Assessment and Plan :   1. Need for influenza vaccination Flu vaccine  given today.  - Flu vaccine HIGH DOSE PF (Fluzone High dose)  2. Essential (primary) hypertension Stable, continue with current medication.  Will obtain labs today  - CBC with Differential/Platelet - Comprehensive metabolic panel  3. Hypothyroidism, unspecified hypothyroidism type Stable on last check, will check level today, continue of Levothyroxine.  - TSH  4. Borderline diabetes A1C 1 year ago was not stable.  Will recheck today.  Patient is doing better with her appetite and has gained weight this visit. - Hemoglobin A1c  5. Hyperlipidemia  - Lipid Panel With LDL/HDL Ratio  6. Sty, external, right Patient has large sty on her right upper outer eyelid.  This may need to be opened.  Advised her to use warm compress until seen by them.  Will refer her to Ophthalmology.  - Ambulatory referral to Ophthalmology 7. Grieving Patient mourning the  loss of her daughter this year to breast cancer  HPI, Exam and A&P Transcribed under the direction and in the presence of Miguel Aschoff, Brooke Bonito., MD. Electronically Signed: Althea Charon, RMA I have done the exam and reviewed the above chart and it is accurate to the best of my knowledge.

## 2016-01-11 ENCOUNTER — Telehealth: Payer: Self-pay

## 2016-01-11 LAB — HEMOGLOBIN A1C
ESTIMATED AVERAGE GLUCOSE: 128 mg/dL
HEMOGLOBIN A1C: 6.1 % — AB (ref 4.8–5.6)

## 2016-01-11 LAB — CBC WITH DIFFERENTIAL/PLATELET
BASOS: 0 %
Basophils Absolute: 0 10*3/uL (ref 0.0–0.2)
EOS (ABSOLUTE): 0.1 10*3/uL (ref 0.0–0.4)
EOS: 2 %
HEMATOCRIT: 35.2 % (ref 34.0–46.6)
HEMOGLOBIN: 11.9 g/dL (ref 11.1–15.9)
IMMATURE GRANS (ABS): 0 10*3/uL (ref 0.0–0.1)
IMMATURE GRANULOCYTES: 0 %
LYMPHS: 20 %
Lymphocytes Absolute: 1.2 10*3/uL (ref 0.7–3.1)
MCH: 33.3 pg — ABNORMAL HIGH (ref 26.6–33.0)
MCHC: 33.8 g/dL (ref 31.5–35.7)
MCV: 99 fL — AB (ref 79–97)
Monocytes Absolute: 0.7 10*3/uL (ref 0.1–0.9)
Monocytes: 12 %
NEUTROS PCT: 66 %
Neutrophils Absolute: 3.8 10*3/uL (ref 1.4–7.0)
PLATELETS: 210 10*3/uL (ref 150–379)
RBC: 3.57 x10E6/uL — ABNORMAL LOW (ref 3.77–5.28)
RDW: 13.1 % (ref 12.3–15.4)
WBC: 5.8 10*3/uL (ref 3.4–10.8)

## 2016-01-11 LAB — COMPREHENSIVE METABOLIC PANEL
A/G RATIO: 1.2 (ref 1.2–2.2)
ALT: 17 IU/L (ref 0–32)
AST: 29 IU/L (ref 0–40)
Albumin: 3.9 g/dL (ref 3.5–4.7)
Alkaline Phosphatase: 74 IU/L (ref 39–117)
BUN/Creatinine Ratio: 25 (ref 12–28)
BUN: 19 mg/dL (ref 8–27)
Bilirubin Total: 0.8 mg/dL (ref 0.0–1.2)
CALCIUM: 9.7 mg/dL (ref 8.7–10.3)
CO2: 24 mmol/L (ref 18–29)
Chloride: 101 mmol/L (ref 96–106)
Creatinine, Ser: 0.76 mg/dL (ref 0.57–1.00)
GFR, EST AFRICAN AMERICAN: 80 mL/min/{1.73_m2} (ref >59)
GFR, EST NON AFRICAN AMERICAN: 70 mL/min/{1.73_m2} (ref >59)
GLOBULIN, TOTAL: 3.2 g/dL (ref 1.5–4.5)
Glucose: 130 mg/dL — ABNORMAL HIGH (ref 65–99)
POTASSIUM: 4.7 mmol/L (ref 3.5–5.2)
Sodium: 140 mmol/L (ref 134–144)
TOTAL PROTEIN: 7.1 g/dL (ref 6.0–8.5)

## 2016-01-11 LAB — LIPID PANEL WITH LDL/HDL RATIO
Cholesterol, Total: 136 mg/dL (ref 100–199)
HDL: 62 mg/dL (ref >39)
LDL CALC: 63 mg/dL (ref 0–99)
LDl/HDL Ratio: 1 ratio units (ref 0.0–3.2)
Triglycerides: 54 mg/dL (ref 0–149)
VLDL CHOLESTEROL CAL: 11 mg/dL (ref 5–40)

## 2016-01-11 LAB — TSH: TSH: 1.9 u[IU]/mL (ref 0.450–4.500)

## 2016-01-11 NOTE — Telephone Encounter (Signed)
Advised pt of lab results. Pt verbally acknowledges understanding. Emily Drozdowski, CMA   

## 2016-01-11 NOTE — Telephone Encounter (Signed)
-----   Message from Jerrol Banana., MD sent at 01/11/2016  8:19 AM EDT ----- Labs stable

## 2016-01-12 DIAGNOSIS — H0011 Chalazion right upper eyelid: Secondary | ICD-10-CM | POA: Diagnosis not present

## 2016-02-02 ENCOUNTER — Ambulatory Visit (INDEPENDENT_AMBULATORY_CARE_PROVIDER_SITE_OTHER): Payer: Medicare Other

## 2016-02-02 DIAGNOSIS — Z9889 Other specified postprocedural states: Secondary | ICD-10-CM

## 2016-02-02 DIAGNOSIS — I4891 Unspecified atrial fibrillation: Secondary | ICD-10-CM

## 2016-02-02 DIAGNOSIS — Z7901 Long term (current) use of anticoagulants: Secondary | ICD-10-CM

## 2016-02-02 LAB — POCT INR: INR: 2.3

## 2016-02-23 ENCOUNTER — Other Ambulatory Visit: Payer: Self-pay | Admitting: Family Medicine

## 2016-02-28 ENCOUNTER — Other Ambulatory Visit: Payer: Self-pay | Admitting: Cardiovascular Disease

## 2016-02-28 ENCOUNTER — Telehealth: Payer: Self-pay | Admitting: Family Medicine

## 2016-02-28 NOTE — Telephone Encounter (Signed)
Pt needs refill on her hydrocodone 5-325  Please call when ready [phone]  Thank sTeri

## 2016-02-28 NOTE — Telephone Encounter (Signed)
Please review-aa 

## 2016-02-28 NOTE — Telephone Encounter (Signed)
ok 

## 2016-02-29 MED ORDER — HYDROCODONE-ACETAMINOPHEN 5-325 MG PO TABS: ORAL_TABLET | ORAL | 0 refills | Status: DC

## 2016-02-29 NOTE — Telephone Encounter (Signed)
RX printed and pt advised-aa 

## 2016-03-13 ENCOUNTER — Ambulatory Visit (INDEPENDENT_AMBULATORY_CARE_PROVIDER_SITE_OTHER): Payer: Medicare Other

## 2016-03-13 ENCOUNTER — Encounter: Payer: Self-pay | Admitting: Cardiovascular Disease

## 2016-03-13 ENCOUNTER — Ambulatory Visit (INDEPENDENT_AMBULATORY_CARE_PROVIDER_SITE_OTHER): Payer: Medicare Other | Admitting: Cardiovascular Disease

## 2016-03-13 VITALS — BP 160/80 | HR 84 | Ht 63.0 in | Wt 105.5 lb

## 2016-03-13 DIAGNOSIS — Z9889 Other specified postprocedural states: Secondary | ICD-10-CM | POA: Diagnosis not present

## 2016-03-13 DIAGNOSIS — I4891 Unspecified atrial fibrillation: Secondary | ICD-10-CM

## 2016-03-13 DIAGNOSIS — Z7901 Long term (current) use of anticoagulants: Secondary | ICD-10-CM

## 2016-03-13 DIAGNOSIS — Z7189 Other specified counseling: Secondary | ICD-10-CM

## 2016-03-13 DIAGNOSIS — I2510 Atherosclerotic heart disease of native coronary artery without angina pectoris: Secondary | ICD-10-CM | POA: Diagnosis not present

## 2016-03-13 DIAGNOSIS — I10 Essential (primary) hypertension: Secondary | ICD-10-CM | POA: Diagnosis not present

## 2016-03-13 DIAGNOSIS — E7800 Pure hypercholesterolemia, unspecified: Secondary | ICD-10-CM | POA: Diagnosis not present

## 2016-03-13 LAB — POCT INR: INR: 3.4

## 2016-03-13 NOTE — Progress Notes (Signed)
Cardiology Office Note  Date:  03/13/2016   ID:  Kathy Mccall, Kathy Mccall 1923/08/19, MRN [id number]  PCP:  Wilhemena Durie, MD   Chief Complaint  Patient presents with  . other    F/u echo. Meds reviewed verbally with pt.    HPI:  Kathy Mccall is a very pleasant 82 year old woman with a history of rheumatic valve disease, [employer] aortic valve replacement in 1991, chronic atrial fibrillation on warfarin,  severe tricuspid regurgitation on echocardiogram dating back to 2008, previous history of mild to moderate pulmonary hypertension (right ventricular systolic pressure estimated at 46), who presents for routine followup of her atrial fibrillation and prosthetic valve  .  She has been stable on Lasix daily.  In follow-up she reports that she feels well No new changes in the way she feels Denies any PND, orthopnea, shortness of breath on exertion, leg swelling Denies any chest pain on exertion Lab work reviewed Total chol 136, LDL 63  she lost her daughter to cancer ,  Colon cancer with metastases to the lung.  Walks with a cane, no recent falls Blood pressure elevated today but she reports is well controlled at home  Echocardiogram from August 2017 reviewed with her showing moderate aortic valve stenosis, severe tricuspid valve regurgitation, moderate MR Results again discussed with her EKG on today's visit shows atrial fibrillation with ventricular rate 84 bpm, left axis deviation  Other past medical history  echo was in early 2014 . Normal ejection fraction, severe TR, well-seated aortic valve .  No falls. No complications on warfarin  Cardiac catheterization in August 2008 showed mild to moderate distal left main disease, moderate ostial left circumflex disease, mild ostial LAD and ostial B1 disease, severe disease of the PL range medical management was recommended.   In the past, she has had difficulty tolerating Lipitor, Now on low-dose pravastatin Total cholesterol  166, LDL 85, HDL 65  PMH:   has a past medical history of Arthritis; Atrial fibrillation ([place]); Congestive heart failure ([place]); Rheumatic fever; and S/P aortic valve replacement.  PSH:    Past Surgical History:  Procedure Laterality Date  . AORTIC VALVE REPLACEMENT    . UPPER GI ENDOSCOPY     01/09/08 normal esophagus, normal duodenum, non-bleeding erosive gastropathy    Current Outpatient Prescriptions  Medication Sig Dispense Refill  . calcium-vitamin D (OSCAL 500/200 D-3) 500-200 MG-UNIT per tablet Take 1 tablet by mouth daily.      [name] DAILY MULTIPLE VITAMINS tablet Take 1 tablet by mouth daily.      [name] FERREX 150 150 MG capsule TAKE ONE CAPSULE BY MOUTH ONCE DAILY 30 capsule 12  . furosemide (LASIX) 20 MG tablet TAKE ONE TABLET BY MOUTH ONCE DAILY 90 tablet 3  . HYDROcodone-acetaminophen (NORCO/VICODIN) 5-325 MG tablet 1/2 to 1 tablet twice a day as needed 60 tablet 0  . KLOR-CON M20 20 MEQ tablet TAKE ONE TABLET BY MOUTH ONCE DAILY 90 tablet 3  . latanoprost (XALATAN) 0.005 % ophthalmic solution 1 drop at bedtime.      [name] levothyroxine (SYNTHROID, LEVOTHROID) 50 MCG tablet TAKE ONE TABLET BY MOUTH ONCE DAILY 30 tablet 6  . lisinopril (PRINIVIL,ZESTRIL) 10 MG tablet TAKE ONE TABLET BY MOUTH ONCE DAILY 90 tablet 3  . omeprazole (PRILOSEC) 20 MG capsule TAKE ONE CAPSULE BY MOUTH ONCE DAILY 30 capsule 12  . pravastatin (PRAVACHOL) 20 MG tablet TAKE ONE TABLET BY MOUTH ONCE DAILY 30 tablet 12  . timolol (TIMOPTIC) 0.5 % ophthalmic  solution 1 drop.      [name] warfarin (COUMADIN) 3 MG tablet TAKE ONE TABLET BY MOUTH ONCE DAILY 30 tablet 12   No current facility-administered medications for this visit.      Allergies:   Sulfa antibiotics and Sulfonamide derivatives   Social History:  The patient  reports that she has never smoked. She has never used smokeless tobacco. She reports that she does not drink alcohol or use drugs.   Family History:   family history includes Arthritis in her  brother and father; Cancer in her brother and daughter; Cerebral palsy in her daughter; Diabetes in her sister; Heart attack in her brother, father, and sister; Heart disease in her mother; Hypertension in her mother.    Review of Systems: Review of Systems  Constitutional: Negative.   Respiratory: Positive for shortness of breath.   Cardiovascular: Negative.   Gastrointestinal: Negative.   Musculoskeletal: Negative.   Neurological: Negative.   Psychiatric/Behavioral: Negative.   All other systems reviewed and are negative.    PHYSICAL EXAM: VS:  BP (!) 160/80 (BP Location: Left Arm, Patient Position: Sitting, Cuff Size: Normal)   Pulse 84   Ht 5\' 3"  (1.6 m)   Wt 105 lb 8 oz (47.9 kg)   BMI 18.69 kg/m  , BMI Body mass index is 18.69 kg/m. GEN: Well nourished, well developed, in no acute distress  HEENT: normal  Neck: no JVD, carotid bruits, or masses CardiacIrregularly irregular,2+ SEM RSB,  no rubs, or gallops,no edema  Respiratory:  clear to auscultation bilaterally, normal work of breathing GI: soft, nontender, nondistended, + BS MS: no deformity or atrophy  Skin: warm and dry, no rash Neuro:  Strength and sensation are intact Psych: euthymic mood, full affect    Recent Labs: 01/10/2016: ALT 17; BUN 19; Creatinine, Ser 0.76; Platelets 210; Potassium 4.7; Sodium 140; TSH 1.900    Lipid Panel Lab Results  Component Value Date   CHOL 136 01/10/2016   HDL 62 01/10/2016   LDLCALC 63 01/10/2016   TRIG 54 01/10/2016      Wt Readings from Last 3 Encounters:  03/13/16 105 lb 8 oz (47.9 kg)  01/10/16 105 lb (47.6 kg)  12/07/15 101 lb 8 oz (46 kg)       ASSESSMENT AND PLAN:  S/P AVR (aortic valve replacement) - Plan: ECHOCARDIOGRAM COMPLETE echocardiogram reviewed with her in detail Moderate aortic valve stenosis, prosthetic valve is more than 80 years old asymptomatic Repeat echocardiogram in one year  Atrial fibrillation with RVR ([place]) - Plan:  ECHOCARDIOGRAM COMPLETE Rate relatively well controlled, tolerating anticoagulation  SOB (shortness of breath) - Plan: ECHOCARDIOGRAM COMPLETE denies significant shortness of breath. Recommended she stay on Lasix daily. Would take extra Lasix after lunch for worsening shortness of breath   Tricuspid valve regurgitation Severe tricuspid valve regurgitation per the echo She is not interested in any surgical options and as she is not very symptomatic, I do not think this would be of benefit. Results previously discussed with her   Aortic valve stenosis Moderate stenosis of prosthetic valve, recommend echo in one year   Total encounter time more than 15 minutes  Greater than 50% was spent in counseling and coordination of care with the patient   Total encounter time more than 15 minutes  Greater than 50% was spent in counseling and coordination of care with the patient   Disposition:   F/U  12 months   Orders Placed This Encounter  Procedures  . EKG 12-Lead  Signed, Esmond Plants, M.D., Ph.D. 03/13/2016  [place], [place]

## 2016-03-13 NOTE — Patient Instructions (Addendum)

## 2016-03-23 ENCOUNTER — Other Ambulatory Visit: Payer: Self-pay | Admitting: Family Medicine

## 2016-04-10 ENCOUNTER — Other Ambulatory Visit: Payer: Self-pay | Admitting: Family Medicine

## 2016-04-10 DIAGNOSIS — H40153 Residual stage of open-angle glaucoma, bilateral: Secondary | ICD-10-CM | POA: Diagnosis not present

## 2016-04-10 NOTE — Telephone Encounter (Signed)
Pharmacy requesting 90 day supply Last ov 01/10/16

## 2016-04-14 ENCOUNTER — Encounter: Payer: Self-pay | Admitting: Emergency Medicine

## 2016-04-14 DIAGNOSIS — R42 Dizziness and giddiness: Secondary | ICD-10-CM | POA: Diagnosis not present

## 2016-04-14 DIAGNOSIS — Z79899 Other long term (current) drug therapy: Secondary | ICD-10-CM | POA: Insufficient documentation

## 2016-04-14 DIAGNOSIS — I10 Essential (primary) hypertension: Secondary | ICD-10-CM | POA: Diagnosis not present

## 2016-04-14 DIAGNOSIS — I2510 Atherosclerotic heart disease of native coronary artery without angina pectoris: Secondary | ICD-10-CM | POA: Diagnosis not present

## 2016-04-14 DIAGNOSIS — E860 Dehydration: Secondary | ICD-10-CM | POA: Diagnosis not present

## 2016-04-14 DIAGNOSIS — Z7901 Long term (current) use of anticoagulants: Secondary | ICD-10-CM | POA: Diagnosis not present

## 2016-04-14 DIAGNOSIS — E039 Hypothyroidism, unspecified: Secondary | ICD-10-CM | POA: Insufficient documentation

## 2016-04-14 NOTE — ED Triage Notes (Addendum)
Pt presents to ED with dizziness that started around 2100 and vomiting X2. Pt alert and able to answer questions appropriately. Family states pt behavior is at baseline. C/o slight headache above her left eyebrow. Hx of the same.

## 2016-04-15 ENCOUNTER — Emergency Department
Admission: EM | Admit: 2016-04-15 | Discharge: 2016-04-15 | Disposition: A | Discharge status: Home / Self Care | Payer: Medicare Other | Attending: Emergency Medicine | Admitting: Emergency Medicine

## 2016-04-15 ENCOUNTER — Emergency Department: Payer: Medicare Other

## 2016-04-15 DIAGNOSIS — R42 Dizziness and giddiness: Secondary | ICD-10-CM

## 2016-04-15 DIAGNOSIS — E860 Dehydration: Secondary | ICD-10-CM

## 2016-04-15 LAB — BASIC METABOLIC PANEL
Anion gap: 9 (ref 5–15)
BUN: 21 mg/dL — ABNORMAL HIGH (ref 6–20)
CO2: 25 mmol/L (ref 22–32)
Calcium: 9.5 mg/dL (ref 8.9–10.3)
Chloride: 102 mmol/L (ref 101–111)
Creatinine, Ser: 0.82 mg/dL (ref 0.44–1.00)
GFR calc Af Amer: >60 mL/min (ref >60)
Glucose, Bld: 173 mg/dL — ABNORMAL HIGH (ref 65–99)
POTASSIUM: 3.6 mmol/L (ref 3.5–5.1)
SODIUM: 136 mmol/L (ref 135–145)

## 2016-04-15 LAB — URINALYSIS, COMPLETE (UACMP) WITH MICROSCOPIC
BACTERIA UA: NONE SEEN
BILIRUBIN URINE: NEGATIVE
GLUCOSE, UA: NEGATIVE mg/dL
HGB URINE DIPSTICK: NEGATIVE
KETONES UR: 20 mg/dL — AB
NITRITE: NEGATIVE
PROTEIN: NEGATIVE mg/dL
RBC / HPF: NONE SEEN RBC/hpf (ref 0–5)
Specific Gravity, Urine: 1.014 (ref 1.005–1.030)
pH: 5 (ref 5.0–8.0)

## 2016-04-15 LAB — CBC
HEMATOCRIT: 36.8 % (ref 35.0–47.0)
Hemoglobin: 12.8 g/dL (ref 12.0–16.0)
MCH: 34.9 pg — ABNORMAL HIGH (ref 26.0–34.0)
MCHC: 34.9 g/dL (ref 32.0–36.0)
MCV: 100.1 fL — ABNORMAL HIGH (ref 80.0–100.0)
Platelets: 192 10*3/uL (ref 150–440)
RBC: 3.67 MIL/uL — AB (ref 3.80–5.20)
RDW: 13.4 % (ref 11.5–14.5)
WBC: 10.5 10*3/uL (ref 3.6–11.0)

## 2016-04-15 LAB — TROPONIN I
Troponin I: <0.03 ng/mL (ref <0.03)
Troponin I: <0.03 ng/mL (ref <0.03)

## 2016-04-15 MED ORDER — SODIUM CHLORIDE 0.9 % IV BOLUS (SEPSIS)
500.0000 mL | Freq: Once | INTRAVENOUS | Status: AC
  Administered 2016-04-15: 500 mL via INTRAVENOUS

## 2016-04-15 NOTE — ED Notes (Signed)
Pt. States feeling dizzy tonight around 2100.  Pt. States vomiting about 4 times tonight.  Pt. Lives by self.

## 2016-04-15 NOTE — ED Notes (Signed)
Pt going home with family

## 2016-04-15 NOTE — ED Provider Notes (Signed)
[facility] Emergency Department Provider Note   ____________________________________________   First MD Initiated Contact with Patient 04/15/16 0128     (approximate)  I have reviewed the triage vital signs and the nursing notes.   HISTORY  Chief Complaint Vomiting and Dizziness    HPI Kathy Mccall is a 82 y.o. female who comes into the hospital today with some dizziness. The patient was watching TV when she got dizzy and lightheaded. She developed some pain over the left eye and vomited 4 times. When she vomited it was just food she had eaten earlier. The patient denies any slurred speech and denies any weakness. She reports that her symptoms are better now. The patient hasn't tried to walk by herself yet. She also reports that the pain over her eye is almost gone. She reports that she's had some mild problems with dizziness in the past but it would go away on its own. The patient denies any chest pain, shortness of breath, abdominal pain, diarrhea, constipation. She has eaten and drank well today.The patient came into the hospital for evaluation.    Past Medical History:  Diagnosis Date  . Arthritis   . Atrial fibrillation ([place])   . Congestive heart failure ([place])   . Rheumatic fever   . S/P aortic valve replacement    [employer] jude    Patient Active Problem List   Diagnosis Date Noted  . Aortic valve stenosis 12/07/2015  . SK (seborrheic keratosis) 07/05/2015  . Encounter for anticoagulation discussion and counseling 03/08/2015  . Absolute anemia 01/04/2015  . Heart valve replaced 01/04/2015  . Arthritis 01/04/2015  . Atherosclerosis of coronary artery 01/04/2015  . Bilateral cataracts 01/04/2015  . DD (diverticular disease) 01/04/2015  . Bleeding from the nose 01/04/2015  . Essential (primary) hypertension 01/04/2015  . Glaucoma 01/04/2015  . Acid reflux 01/04/2015  . Borderline diabetes 01/04/2015  . H/O acute myocardial infarction  01/04/2015  . H/O gastric ulcer 01/04/2015  . Hypothyroidism 01/04/2015  . Arthritis, degenerative 01/04/2015  . OP (osteoporosis) 01/04/2015  . Post menopausal syndrome 01/04/2015  . Basal cell papilloma 01/04/2015  . Heart valve disease 01/04/2015  . Osteoarthritis 10/16/2014  . Current use of long term anticoagulation 04/25/2012  . Hyperlipidemia 01/14/2010  . HYPERTENSION, BENIGN 01/14/2010  . CAD, NATIVE VESSEL 01/14/2010  . Atrial fibrillation ([place]) 01/14/2010  . HEART VALVE REPLACEMENT, HX OF 01/14/2010  . Hypercholesteremia 12/11/2006  . Chest pain, precordial 12/11/2006    Past Surgical History:  Procedure Laterality Date  . AORTIC VALVE REPLACEMENT    . UPPER GI ENDOSCOPY     01/09/08 normal esophagus, normal duodenum, non-bleeding erosive gastropathy    Prior to Admission medications   Medication Sig Start Date End Date Taking? Authorizing Provider  calcium-vitamin D (OSCAL 500/200 D-3) 500-200 MG-UNIT per tablet Take 1 tablet by mouth daily.      Historical Provider, MD  DAILY MULTIPLE VITAMINS tablet Take 1 tablet by mouth daily.      Historical Provider, MD  FERREX 150 150 MG capsule TAKE ONE CAPSULE BY MOUTH ONCE DAILY 06/28/15   Jerrol Banana., MD  furosemide (LASIX) 20 MG tablet TAKE ONE TABLET BY MOUTH ONCE DAILY 02/29/16   Minna Merritts, MD  HYDROcodone-acetaminophen (NORCO/VICODIN) 5-325 MG tablet 1/2 to 1 tablet twice a day as needed 02/29/16   Jerrol Banana., MD  KLOR-CON M20 20 MEQ tablet TAKE ONE TABLET BY MOUTH ONCE DAILY 02/29/16   Kathlene November  Gollan, MD  latanoprost (XALATAN) 0.005 % ophthalmic solution 1 drop at bedtime.      Historical Provider, MD  levothyroxine (SYNTHROID, LEVOTHROID) 50 MCG tablet TAKE ONE TABLET BY MOUTH ONCE DAILY 04/11/16   Jerrol Banana., MD  lisinopril (PRINIVIL,ZESTRIL) 10 MG tablet TAKE ONE TABLET BY MOUTH ONCE DAILY 02/24/16   Jerrol Banana., MD  omeprazole (PRILOSEC) 20 MG capsule TAKE ONE CAPSULE BY  MOUTH ONCE DAILY 04/11/16   Jerrol Banana., MD  pravastatin (PRAVACHOL) 20 MG tablet TAKE ONE TABLET BY MOUTH ONCE DAILY 04/13/15   Jerrol Banana., MD  timolol (TIMOPTIC) 0.5 % ophthalmic solution 1 drop.      Historical Provider, MD  warfarin (COUMADIN) 3 MG tablet TAKE ONE TABLET BY MOUTH ONCE DAILY 03/23/16   Jerrol Banana., MD    Allergies Sulfa antibiotics and Sulfonamide derivatives  Family History  Problem Relation Age of Onset  . Heart disease Mother   . Hypertension Mother   . Arthritis Father   . Heart attack Father   . Heart attack Sister   . Diabetes Sister   . Cancer Brother   . Heart attack Brother   . Arthritis Brother   . Cancer Daughter     Colon cancer  . Cerebral palsy Daughter     Social History Social History  Substance Use Topics  . Smoking status: Never Smoker  . Smokeless tobacco: Never Used     Comment: tobacco use- no   . Alcohol use No    Review of Systems Constitutional: No fever/chills Eyes: No visual changes. ENT: No sore throat. Cardiovascular: Denies chest pain. Respiratory: Denies shortness of breath. Gastrointestinal: Nausea and vomiting No abdominal pain. No diarrhea.  No constipation. Genitourinary: Negative for dysuria. Musculoskeletal: Negative for back pain. Skin: Negative for rash. Neurological: dizziness  10-point ROS otherwise negative.  ____________________________________________   PHYSICAL EXAM:  VITAL SIGNS: ED Triage Vitals  Enc Vitals Group     BP 04/15/16 0130 130/71     Pulse Rate 04/15/16 0130 81     Resp 04/15/16 0247 16     Temp --      Temp src --      SpO2 04/15/16 0130 98 %     Weight 04/14/16 2333 105 lb (47.6 kg)     Height 04/14/16 2333 5' (1.524 m)     Head Circumference --      Peak Flow --      Pain Score 04/14/16 2334 4     Pain Loc --      Pain Edu? --      Excl. in [place]? --     Constitutional: Alert and oriented. Well appearing and in no acute distress. Eyes:  Conjunctivae are normal. PERRL. EOMI. Head: Atraumatic. Nose: No congestion/rhinnorhea. Mouth/Throat: Mucous membranes are moist.  Oropharynx non-erythematous. Cardiovascular: Normal rate, regular rhythm. Grossly normal heart sounds.  Good peripheral circulation. Respiratory: Normal respiratory effort.  No retractions. Lungs CTAB. Gastrointestinal: Soft and nontender. No distention. Positive bowel sounds Musculoskeletal: No lower extremity tenderness nor edema.   Neurologic:  Normal speech and language. Radial nerves II through XII are grossly intact with no focal motor or neuro deficits Skin:  Skin is warm, dry and intact.  Psychiatric: Mood and affect are normal.   ____________________________________________   LABS (all labs ordered are listed, but only abnormal results are displayed)  Labs Reviewed  BASIC METABOLIC PANEL - Abnormal; Notable for the following:  Result Value   Glucose, Bld 173 (*)    BUN 21 (*)    All other components within normal limits  CBC - Abnormal; Notable for the following:    RBC 3.67 (*)    MCV 100.1 (*)    MCH 34.9 (*)    All other components within normal limits  URINALYSIS, COMPLETE (UACMP) WITH MICROSCOPIC - Abnormal; Notable for the following:    Color, Urine YELLOW (*)    APPearance CLEAR (*)    Ketones, ur 20 (*)    Leukocytes, UA TRACE (*)    Squamous Epithelial / LPF 0-5 (*)    All other components within normal limits  TROPONIN I  TROPONIN I   ____________________________________________  EKG  ED ECG REPORT I, Loney Hering, the attending physician, personally viewed and interpreted this ECG.   Date: 04/14/2016  EKG Time: 2324  Rate: 83  Rhythm: atrial fibrillation  Axis: normal  Intervals:none  ST&T Change: none  ____________________________________________  RADIOLOGY  CT head ____________________________________________   PROCEDURES  Procedure(s) performed: None  Procedures  Critical Care performed:  No  ____________________________________________   INITIAL IMPRESSION / ASSESSMENT AND PLAN / ED COURSE  Pertinent labs & imaging results that were available during my care of the patient were reviewed by me and considered in my medical decision making (see chart for details).  This is a 82 year old female who comes into the hospital today with some dizziness and vomiting. The patient also developed the pain over her left eye. The patient reports her symptoms are improved at this time. I did check the patient's blood work and is unremarkable. I will check some orthostatics and give her 500, bolus of normal saline. I will then reassess the patient and her symptoms.  Clinical Course as of Apr 15 600  Sat Apr 15, 2016  0113 1. No acute intracranial abnormality. 2. Chronic microvascular ischemia.   CT Head Wo Contrast [AW]    Clinical Course User Index [AW] Loney Hering, MD   The patient's repeat troponin is unremarkable. After the fluids the patient reports that she feels much better and her symptoms are all resolved. At this point I'm unsure if she had some vertigo for some mild dehydration causing her symptoms but as her blood work is unremarkable feel it is appropriate for the patient to be discharged home. I discussed with the patient reasons to return and she understands the plans. The patient will be discharged to follow-up with her primary care physician.  ____________________________________________   FINAL CLINICAL IMPRESSION(S) / ED DIAGNOSES  Final diagnoses:  Dizziness  Dehydration      NEW MEDICATIONS STARTED DURING THIS VISIT:  Discharge Medication List as of 04/15/2016  5:01 AM       Note:  This document was prepared using Dragon voice recognition software and may include unintentional dictation errors.    Loney Hering, MD 04/15/16 [phone]

## 2016-04-26 ENCOUNTER — Ambulatory Visit (INDEPENDENT_AMBULATORY_CARE_PROVIDER_SITE_OTHER): Payer: Medicare Other

## 2016-04-26 DIAGNOSIS — Z7901 Long term (current) use of anticoagulants: Secondary | ICD-10-CM

## 2016-04-26 DIAGNOSIS — Z9889 Other specified postprocedural states: Secondary | ICD-10-CM

## 2016-04-26 DIAGNOSIS — I4891 Unspecified atrial fibrillation: Secondary | ICD-10-CM | POA: Diagnosis not present

## 2016-04-26 LAB — POCT INR: INR: 2.4

## 2016-05-11 ENCOUNTER — Other Ambulatory Visit: Payer: Self-pay | Admitting: Family Medicine

## 2016-05-12 ENCOUNTER — Other Ambulatory Visit: Payer: Self-pay

## 2016-05-12 MED ORDER — PRAVASTATIN SODIUM 20 MG PO TABS: 20.0000 mg | ORAL_TABLET | Freq: Every day | ORAL | 12 refills | Status: DC

## 2016-06-07 ENCOUNTER — Ambulatory Visit (INDEPENDENT_AMBULATORY_CARE_PROVIDER_SITE_OTHER): Payer: Medicare Other

## 2016-06-07 DIAGNOSIS — Z9889 Other specified postprocedural states: Secondary | ICD-10-CM

## 2016-06-07 DIAGNOSIS — Z7901 Long term (current) use of anticoagulants: Secondary | ICD-10-CM | POA: Diagnosis not present

## 2016-06-07 DIAGNOSIS — I4891 Unspecified atrial fibrillation: Secondary | ICD-10-CM | POA: Diagnosis not present

## 2016-06-07 LAB — POCT INR: INR: 3

## 2016-07-02 ENCOUNTER — Other Ambulatory Visit: Payer: Self-pay | Admitting: Family Medicine

## 2016-07-04 ENCOUNTER — Other Ambulatory Visit: Payer: Self-pay

## 2016-07-04 NOTE — Telephone Encounter (Signed)
Has appointment 07/06/2016. Last refill 02/29/2016. Renaldo Fiddler, CMA

## 2016-07-04 NOTE — Telephone Encounter (Signed)
Please review-aa 

## 2016-07-05 ENCOUNTER — Other Ambulatory Visit: Payer: Self-pay

## 2016-07-05 MED ORDER — HYDROCODONE-ACETAMINOPHEN 5-325 MG PO TABS: ORAL_TABLET | ORAL | 0 refills | Status: DC

## 2016-07-05 MED ORDER — PRAVASTATIN SODIUM 20 MG PO TABS: 20.0000 mg | ORAL_TABLET | Freq: Every day | ORAL | 3 refills | Status: DC

## 2016-07-06 ENCOUNTER — Ambulatory Visit (INDEPENDENT_AMBULATORY_CARE_PROVIDER_SITE_OTHER): Payer: Medicare Other | Admitting: Family Medicine

## 2016-07-06 ENCOUNTER — Encounter: Payer: Self-pay | Admitting: Family Medicine

## 2016-07-06 ENCOUNTER — Ambulatory Visit (INDEPENDENT_AMBULATORY_CARE_PROVIDER_SITE_OTHER): Payer: Medicare Other

## 2016-07-06 VITALS — BP 132/72 | HR 76 | Temp 98.0°F | Ht 60.0 in | Wt 105.0 lb

## 2016-07-06 VITALS — BP 138/72 | HR 76 | Temp 98.0°F | Ht 60.0 in | Wt 105.8 lb

## 2016-07-06 DIAGNOSIS — I10 Essential (primary) hypertension: Secondary | ICD-10-CM

## 2016-07-06 DIAGNOSIS — E7800 Pure hypercholesterolemia, unspecified: Secondary | ICD-10-CM | POA: Diagnosis not present

## 2016-07-06 DIAGNOSIS — R739 Hyperglycemia, unspecified: Secondary | ICD-10-CM | POA: Diagnosis not present

## 2016-07-06 DIAGNOSIS — Z0000 Encounter for general adult medical examination without abnormal findings: Secondary | ICD-10-CM | POA: Diagnosis not present

## 2016-07-06 DIAGNOSIS — I4891 Unspecified atrial fibrillation: Secondary | ICD-10-CM | POA: Diagnosis not present

## 2016-07-06 LAB — POCT GLYCOSYLATED HEMOGLOBIN (HGB A1C): HEMOGLOBIN A1C: 6.2

## 2016-07-06 NOTE — Progress Notes (Signed)
Subjective:   Kathy Mccall is a 83 y.o. female who presents for Medicare Annual (Subsequent) preventive examination.  Review of Systems:  N/A  Cardiac Risk Factors include: advanced age (>56men, >53 women);dyslipidemia;hypertension     Objective:     Vitals: BP 138/72 (BP Location: Right Arm)   Pulse 76   Temp 98 F (36.7 C) (Oral)   Ht 5' (1.524 m)   Wt 105 lb 12.8 oz (48 kg)   BMI 20.66 kg/m   Body mass index is 20.66 kg/m.   Tobacco History  Smoking Status  . Never Smoker  Smokeless Tobacco  . Never Used    Comment: tobacco use- no      Counseling given: Not Answered   Past Medical History:  Diagnosis Date  . Arthritis   . Atrial fibrillation ([place])   . Congestive heart failure ([place])   . Hyperlipidemia   . Hypertension   . Rheumatic fever   . S/P aortic valve replacement    [facility] jud[facility]   Past Surgical History:  Procedure Laterality Date  . AORTIC VALVE REPLACEMENT    . UPPER GI ENDOSCOPY     01/09/08 normal esophagus, normal duodenum, non-bleeding erosive gastropathy   Family History  Problem Relation Age of Onset  . Heart disease Mother   . Hypertension Mother   . Arthritis Father   . Heart attack Father   . Heart attack Sister   . Diabetes Sister   . Cancer Brother   . Heart attack Brother   . Arthritis Brother   . Cancer Daughter     Colon cancer  . Cerebral palsy Daughter    History  Sexual Activity  . Sexual activity: No    Outpatient Encounter Prescriptions as of 07/06/2016  Medication Sig  . calcium-vitamin D (OSCAL 500/200 D-3) 500-200 MG-UNIT per tablet Take 1 tablet by mouth daily.    [name] DAILY MULTIPLE VITAMINS tablet Take 1 tablet by mouth daily.    [name] FERREX 150 150 MG capsule TAKE ONE CAPSULE BY MOUTH ONCE DAILY  . furosemide (LASIX) 20 MG tablet TAKE ONE TABLET BY MOUTH ONCE DAILY  . HYDROcodone-acetaminophen (NORCO/VICODIN) 5-325 MG tablet 1/2 to 1 tablet twice a day as needed  . KLOR-CON M20 20 MEQ tablet TAKE ONE TABLET  BY MOUTH ONCE DAILY  . latanoprost (XALATAN) 0.005 % ophthalmic solution 1 drop at bedtime.    [name] levothyroxine (SYNTHROID, LEVOTHROID) 50 MCG tablet TAKE ONE TABLET BY MOUTH ONCE DAILY  . lisinopril (PRINIVIL,ZESTRIL) 10 MG tablet TAKE ONE TABLET BY MOUTH ONCE DAILY  . omeprazole (PRILOSEC) 20 MG capsule TAKE ONE CAPSULE BY MOUTH ONCE DAILY  . pravastatin (PRAVACHOL) 20 MG tablet Take 1 tablet (20 mg total) by mouth daily.  . timolol (TIMOPTIC) 0.5 % ophthalmic solution 1 drop.    [name] warfarin (COUMADIN) 3 MG tablet TAKE ONE TABLET BY MOUTH ONCE DAILY   No facility-administered encounter medications on file as of 07/06/2016.     Activities of Daily Living In your present state of health, do you have any difficulty performing the following activities: 07/06/2016  Hearing? Y  Vision? N  Difficulty concentrating or making decisions? N  Walking or climbing stairs? Y  Dressing or bathing? N  Doing errands, shopping? N  Preparing Food and eating ? N  Using the Toilet? N  In the past six months, have you accidently leaked urine? N  Do you have problems with loss of bowel control? N  Managing your Medications?  N  Managing your Finances? N  Housekeeping or managing your Housekeeping? N  Some recent data might be hidden    Patient Care Team: Jerrol Banana., MD as PCP - General (Unknown Physician Specialty) Minna Merritts, MD as Consulting Physician (Cardiology) Crista Curb. Gloriann Loan, MD as Consulting Physician (Ophthalmology)    Assessment:    Exercise Activities and Dietary recommendations Current Exercise Habits: The patient has a physically strenous job, but has no regular exercise apart from work., Exercise limited by: None identified  Goals    . Increase water intake          Recommend drinking 4-5 glasses of water a day.      Fall Risk Fall Risk  07/06/2016 07/06/2015 01/04/2015  Falls in the past year? No No No   Depression Screen PHQ 2/9 Scores 07/06/2016 07/06/2015  01/04/2015  PHQ - 2 Score 1 0 0     Cognitive Function     6CIT Screen 07/06/2016  What Year? 0 points  What month? 0 points  What time? 0 points  Count back from 20 0 points  Months in reverse 0 points  Repeat phrase 8 points  Total Score 8    Immunization History  Administered Date(s) Administered  . Influenza, High Dose Seasonal PF 01/04/2015, 01/10/2016  . Pneumococcal Conjugate-13 12/23/2013  . Td 09/22/1993   Screening Tests Health Maintenance  Topic Date Due  . TETANUS/TDAP  04/24/2026 (Originally 09/23/2003)  . INFLUENZA VACCINE  Completed  . DEXA SCAN  Completed  . PNA vac Low Risk Adult  Completed      Plan:  I have personally reviewed and addressed the Medicare Annual Wellness questionnaire and have noted the following in the patient's chart:  A. Medical and social history B. Use of alcohol, tobacco or illicit drugs  C. Current medications and supplements D. Functional ability and status E.  Nutritional status F.  Physical activity G. Advance directives H. List of other physicians I.  Hospitalizations, surgeries, and ER visits in previous 12 months J.  [place] such as hearing and vision if needed, cognitive and depression L. Referrals and appointments - none  In addition, I have reviewed and discussed with patient certain preventive protocols, quality metrics, and best practice recommendations. A written personalized care plan for preventive services as well as general preventive health recommendations were provided to patient.  See attached scanned questionnaire for additional information.   Signed,  Fabio Neighbors, LPN Nurse Health Advisor   MD Recommendations: None. Pt declined tetanus vaccine today. I have reviewed the health advisors note, was  available for consultation and I agree with documentation and plan. Miguel Aschoff MD [facility]

## 2016-07-06 NOTE — Patient Instructions (Signed)
Kathy Mccall , Thank you for taking time to come for your Medicare Wellness Visit. I appreciate your ongoing commitment to your health goals. Please review the following plan we discussed and let me know if I can assist you in the future.   Screening recommendations/referrals: Colonoscopy 01/09/2008 Mammogram 02/03/2014 Bone Density N/A Recommended yearly ophthalmology/optometry visit for glaucoma screening and checkup Recommended yearly dental visit for hygiene and checkup  Vaccinations: Influenza vaccine - completed Pneumococcal vaccine - completed Tdap vaccine - denied Shingles vaccine - N/A  Advanced directives: Denied   Next appointment: Today @ 1:30 with PCP   Preventive Care 5 Years and Older, Female Preventive care refers to lifestyle choices and visits with your health care provider that can promote health and wellness. What does preventive care include?  A yearly physical exam. This is also called an annual well check.  Dental exams once or twice a year.  Routine eye exams. Ask your health care provider how often you should have your eyes checked.  Personal lifestyle choices, including:  Daily care of your teeth and gums.  Regular physical activity.  Eating a healthy diet.  Avoiding tobacco and drug use.  Limiting alcohol use.  Practicing safe sex.  Taking low-dose aspirin every day.  Taking vitamin and mineral supplements as recommended by your health care provider. What happens during an annual well check? The services and screenings done by your health care provider during your annual well check will depend on your age, overall health, lifestyle risk factors, and family history of disease. Counseling  Your health care provider may ask you questions about your:  Alcohol use.  Tobacco use.  Drug use.  Emotional well-being.  Home and relationship well-being.  Sexual activity.  Eating habits.  History of falls.  Memory and ability to  understand (cognition).  Work and work Statistician.  Reproductive health. Screening  You may have the following tests or measurements:  Height, weight, and BMI.  Blood pressure.  Lipid and cholesterol levels. These may be checked every 5 years, or more frequently if you are over 48 years old.  Skin check.  Lung cancer screening. You may have this screening every year starting at age 42 if you have a 30-pack-year history of smoking and currently smoke or have quit within the past 15 years.  Fecal occult blood test (FOBT) of the stool. You may have this test every year starting at age 15.  Flexible sigmoidoscopy or colonoscopy. You may have a sigmoidoscopy every 5 years or a colonoscopy every 10 years starting at age 53.  Hepatitis C blood test.  Hepatitis B blood test.  Sexually transmitted disease (STD) testing.  Diabetes screening. This is done by checking your blood sugar (glucose) after you have not eaten for a while (fasting). You may have this done every 1-3 years.  Bone density scan. This is done to screen for osteoporosis. You may have this done starting at age 31.  Mammogram. This may be done every 1-2 years. Talk to your health care provider about how often you should have regular mammograms. Talk with your health care provider about your test results, treatment options, and if necessary, the need for more tests. Vaccines  Your health care provider may recommend certain vaccines, such as:  Influenza vaccine. This is recommended every year.  Tetanus, diphtheria, and acellular pertussis (Tdap, Td) vaccine. You may need a Td booster every 10 years.  Zoster vaccine. You may need this after age 87.  Pneumococcal 13-valent conjugate (  PCV13) vaccine. One dose is recommended after age 54.  Pneumococcal polysaccharide (PPSV23) vaccine. One dose is recommended after age 78. Talk to your health care provider about which screenings and vaccines you need and how often you  need them. This information is not intended to replace advice given to you by your health care provider. Make sure you discuss any questions you have with your health care provider. Document Released: 05/07/2015 Document Revised: 12/29/2015 Document Reviewed: 02/09/2015 Elsevier Interactive Patient Education  2017 [employer] Prevention in the Home Falls can cause injuries. They can happen to people of all ages. There are many things you can do to make your home safe and to help prevent falls. What can I do on the outside of my home?  Regularly fix the edges of walkways and driveways and fix any cracks.  Remove anything that might make you trip as you walk through a door, such as a raised step or threshold.  Trim any bushes or trees on the path to your home.  Use bright outdoor lighting.  Clear any walking paths of anything that might make someone trip, such as rocks or tools.  Regularly check to see if handrails are loose or broken. Make sure that both sides of any steps have handrails.  Any raised decks and porches should have guardrails on the edges.  Have any leaves, snow, or ice cleared regularly.  Use sand or salt on walking paths during winter.  Clean up any spills in your garage right away. This includes oil or grease spills. What can I do in the bathroom?  Use night lights.  Install grab bars by the toilet and in the tub and shower. Do not use towel bars as grab bars.  Use non-skid mats or decals in the tub or shower.  If you need to sit down in the shower, use a plastic, non-slip stool.  Keep the floor dry. Clean up any water that spills on the floor as soon as it happens.  Remove soap buildup in the tub or shower regularly.  Attach bath mats securely with double-sided non-slip rug tape.  Do not have throw rugs and other things on the floor that can make you trip. What can I do in the bedroom?  Use night lights.  Make sure that you have a light by  your bed that is easy to reach.  Do not use any sheets or blankets that are too big for your bed. They should not hang down onto the floor.  Have a firm chair that has side arms. You can use this for support while you get dressed.  Do not have throw rugs and other things on the floor that can make you trip. What can I do in the kitchen?  Clean up any spills right away.  Avoid walking on wet floors.  Keep items that you use a lot in easy-to-reach places.  If you need to reach something above you, use a strong step stool that has a grab bar.  Keep electrical cords out of the way.  Do not use floor polish or wax that makes floors slippery. If you must use wax, use non-skid floor wax.  Do not have throw rugs and other things on the floor that can make you trip. What can I do with my stairs?  Do not leave any items on the stairs.  Make sure that there are handrails on both sides of the stairs and use them. Fix handrails that  are broken or loose. Make sure that handrails are as long as the stairways.  Check any carpeting to make sure that it is firmly attached to the stairs. Fix any carpet that is loose or worn.  Avoid having throw rugs at the top or bottom of the stairs. If you do have throw rugs, attach them to the floor with carpet tape.  Make sure that you have a light switch at the top of the stairs and the bottom of the stairs. If you do not have them, ask someone to add them for you. What else can I do to help prevent falls?  Wear shoes that:  Do not have high heels.  Have rubber bottoms.  Are comfortable and fit you well.  Are closed at the toe. Do not wear sandals.  If you use a stepladder:  Make sure that it is fully opened. Do not climb a closed stepladder.  Make sure that both sides of the stepladder are locked into place.  Ask someone to hold it for you, if possible.  Clearly mark and make sure that you can see:  Any grab bars or handrails.  First and  last steps.  Where the edge of each step is.  Use tools that help you move around (mobility aids) if they are needed. These include:  Canes.  Walkers.  Scooters.  Crutches.  Turn on the lights when you go into a dark area. Replace any light bulbs as soon as they burn out.  Set up your furniture so you have a clear path. Avoid moving your furniture around.  If any of your floors are uneven, fix them.  If there are any pets around you, be aware of where they are.  Review your medicines with your doctor. Some medicines can make you feel dizzy. This can increase your chance of falling. Ask your doctor what other things that you can do to help prevent falls. This information is not intended to replace advice given to you by your health care provider. Make sure you discuss any questions you have with your health care provider. Document Released: 02/04/2009 Document Revised: 09/16/2015 Document Reviewed: 05/15/2014 Elsevier Interactive Patient Education  2017 [employer].

## 2016-07-06 NOTE — Progress Notes (Signed)
Subjective:  HPI   Pt saw nurse health advise today as well.    Hypertension, follow-up:  BP Readings from Last 3 Encounters:  07/06/16 132/72  07/06/16 138/72  04/15/16 108/64    She was last seen for hypertension 4 months ago.  BP at that visit was 108/64. Management since that visit includes none. She reports good compliance with treatment. She is not having side effects.  She is not exercising. She is adherent to low salt diet.   Outside blood pressures are 130's/70's. She is experiencing none.  Patient denies chest pain, chest pressure/discomfort, claudication, dyspnea, exertional chest pressure/discomfort, fatigue, irregular heart beat, lower extremity edema, near-syncope, orthopnea, palpitations, paroxysmal nocturnal dyspnea, syncope and tachypnea.   Wt Readings from Last 3 Encounters:  07/06/16 105 lb (47.6 kg)  07/06/16 105 lb 12.8 oz (48 kg)  04/14/16 105 lb (47.6 kg)   ------------------------------------------------------------------------  Hyperglycemia- last a1c was 6.1 on 01/10/16   Prior to Admission medications   Medication Sig Start Date End Date Taking? Authorizing Provider  calcium-vitamin D (OSCAL 500/200 D-3) 500-200 MG-UNIT per tablet Take 1 tablet by mouth daily.      Historical Provider, MD  DAILY MULTIPLE VITAMINS tablet Take 1 tablet by mouth daily.      Historical Provider, MD  FERREX 150 150 MG capsule TAKE ONE CAPSULE BY MOUTH ONCE DAILY 07/03/16   Jerrol Banana., MD  furosemide (LASIX) 20 MG tablet TAKE ONE TABLET BY MOUTH ONCE DAILY 02/29/16   Minna Merritts, MD  HYDROcodone-acetaminophen (NORCO/VICODIN) 5-325 MG tablet 1/2 to 1 tablet twice a day as needed 07/05/16   Jerrol Banana., MD  KLOR-CON M20 20 MEQ tablet TAKE ONE TABLET BY MOUTH ONCE DAILY 02/29/16   Minna Merritts, MD  latanoprost (XALATAN) 0.005 % ophthalmic solution 1 drop at bedtime.      Historical Provider, MD  levothyroxine (SYNTHROID, LEVOTHROID) 50 MCG  tablet TAKE ONE TABLET BY MOUTH ONCE DAILY 04/11/16   Jerrol Banana., MD  lisinopril (PRINIVIL,ZESTRIL) 10 MG tablet TAKE ONE TABLET BY MOUTH ONCE DAILY 02/24/16   Jerrol Banana., MD  omeprazole (PRILOSEC) 20 MG capsule TAKE ONE CAPSULE BY MOUTH ONCE DAILY 04/11/16   Jerrol Banana., MD  pravastatin (PRAVACHOL) 20 MG tablet Take 1 tablet (20 mg total) by mouth daily. 07/05/16   Sherion Dooly Maceo Pro., MD  timolol (TIMOPTIC) 0.5 % ophthalmic solution 1 drop.      Historical Provider, MD  warfarin (COUMADIN) 3 MG tablet TAKE ONE TABLET BY MOUTH ONCE DAILY 03/23/16   Jerrol Banana., MD    Patient Active Problem List   Diagnosis Date Noted  . Aortic valve stenosis 12/07/2015  . SK (seborrheic keratosis) 07/05/2015  . Encounter for anticoagulation discussion and counseling 03/08/2015  . Absolute anemia 01/04/2015  . Heart valve replaced 01/04/2015  . Arthritis 01/04/2015  . Atherosclerosis of coronary artery 01/04/2015  . Bilateral cataracts 01/04/2015  . DD (diverticular disease) 01/04/2015  . Bleeding from the nose 01/04/2015  . Essential (primary) hypertension 01/04/2015  . Glaucoma 01/04/2015  . Acid reflux 01/04/2015  . Borderline diabetes 01/04/2015  . H/O acute myocardial infarction 01/04/2015  . H/O gastric ulcer 01/04/2015  . Hypothyroidism 01/04/2015  . Arthritis, degenerative 01/04/2015  . OP (osteoporosis) 01/04/2015  . Post menopausal syndrome 01/04/2015  . Basal cell papilloma 01/04/2015  . Heart valve disease 01/04/2015  . Osteoarthritis 10/16/2014  . Current use of long  term anticoagulation 04/25/2012  . Hyperlipidemia 01/14/2010  . HYPERTENSION, BENIGN 01/14/2010  . CAD, NATIVE VESSEL 01/14/2010  . Atrial fibrillation ([place]) 01/14/2010  . HEART VALVE REPLACEMENT, HX OF 01/14/2010  . Hypercholesteremia 12/11/2006  . Chest pain, precordial 12/11/2006    Past Medical History:  Diagnosis Date  . Arthritis   . Atrial fibrillation ([place])   .  Congestive heart failure ([place])   . Hyperlipidemia   . Hypertension   . Rheumatic fever   . S/P aortic valve replacement    [facility]    Social History   Social History  . Marital status: Widowed    Spouse name: N/A  . Number of children: N/A  . Years of education: N/A   Occupational History  . Not on file.   Social History Main Topics  . Smoking status: Never Smoker  . Smokeless tobacco: Never Used     Comment: tobacco use- no   . Alcohol use No  . Drug use: No  . Sexual activity: No   Other Topics Concern  . Not on file   Social History Narrative   Retired. Regularly exercises.     Allergies  Allergen Reactions  . Sulfa Antibiotics   . Sulfonamide Derivatives     Review of Systems  Constitutional: Negative.   HENT: Negative.   Eyes: Negative.   Respiratory: Negative.   Cardiovascular: Negative.   Gastrointestinal: Negative.   Genitourinary: Negative.   Musculoskeletal: Negative.   Skin: Negative.   Neurological: Negative.   Endo/Heme/Allergies: Negative.   Psychiatric/Behavioral: Negative.     Immunization History  Administered Date(s) Administered  . Influenza, High Dose Seasonal PF 01/04/2015, 01/10/2016  . Pneumococcal Conjugate-13 12/23/2013  . Td 09/22/1993    Objective:  BP 132/72   Pulse 76   Temp 98 F (36.7 C) (Oral)   Ht 5' (1.524 m)   Wt 105 lb (47.6 kg)   BMI 20.51 kg/m   Physical Exam  Constitutional: She is oriented to person, place, and time and well-developed, well-nourished, and in no distress.  Eyes: Conjunctivae and EOM are normal. Pupils are equal, round, and reactive to light.  Neck: Normal range of motion. Neck supple.  Cardiovascular: Normal rate, regular rhythm and intact distal pulses.   Murmur (3/6 systolic murmur) heard. Pulmonary/Chest: Effort normal and breath sounds normal.  Neurological: She is alert and oriented to person, place, and time. She has normal reflexes. Gait normal. GCS score is 15.  Skin: Skin  is warm and dry.  Psychiatric: Mood, memory, affect and judgment normal.    Lab Results  Component Value Date   WBC 10.5 04/15/2016   HGB 12.8 04/15/2016   HCT 36.8 04/15/2016   PLT 192 04/15/2016   GLUCOSE 173 (H) 04/15/2016   CHOL 136 01/10/2016   TRIG 54 01/10/2016   HDL 62 01/10/2016   LDLCALC 63 01/10/2016   TSH 1.900 01/10/2016   INR 3.0 06/07/2016   HGBA1C 6.1 (H) 01/10/2016    CMP     Component Value Date/Time   NA 136 04/15/2016 0116   NA 140 01/10/2016 1115   K 3.6 04/15/2016 0116   CL 102 04/15/2016 0116   CO2 25 04/15/2016 0116   GLUCOSE 173 (H) 04/15/2016 0116   BUN 21 (H) 04/15/2016 0116   BUN 19 01/10/2016 1115   CREATININE 0.82 04/15/2016 0116   CALCIUM 9.5 04/15/2016 0116   PROT 7.1 01/10/2016 1115   ALBUMIN 3.9 01/10/2016 1115   AST 29 01/10/2016 1115  ALT 17 01/10/2016 1115   ALKPHOS 74 01/10/2016 1115   BILITOT 0.8 01/10/2016 1115   GFRNONAA >60 04/15/2016 0116   GFRAA >60 04/15/2016 0116    Assessment and Plan :  1. Essential hypertension Stable.   2. Pure hypercholesterolemia   3. Hyperglycemia  - POCT HgB A1C 6.2 today. Stable.   4. Atrial fibrillation, unspecified type (HCC)   HPI, Exam, and A&P Transcribed under the direction and in the presence of La Shehan L. Cranford Mon, MD  Electronically Signed: Katina Dung, CMA I have done the exam and reviewed the chart and it is accurate to the best of my knowledge. Development worker, community has been used and  any errors in dictation or transcription are unintentional. Miguel Aschoff M.D. [place] MD [facility] 07/06/2016 1:44 PM

## 2016-07-19 ENCOUNTER — Ambulatory Visit (INDEPENDENT_AMBULATORY_CARE_PROVIDER_SITE_OTHER): Payer: Medicare Other

## 2016-07-19 DIAGNOSIS — Z7901 Long term (current) use of anticoagulants: Secondary | ICD-10-CM

## 2016-07-19 DIAGNOSIS — Z9889 Other specified postprocedural states: Secondary | ICD-10-CM | POA: Diagnosis not present

## 2016-07-19 DIAGNOSIS — I4891 Unspecified atrial fibrillation: Secondary | ICD-10-CM | POA: Diagnosis not present

## 2016-07-19 LAB — POCT INR: INR: 2.6

## 2016-08-08 DIAGNOSIS — H40153 Residual stage of open-angle glaucoma, bilateral: Secondary | ICD-10-CM | POA: Diagnosis not present

## 2016-08-30 ENCOUNTER — Ambulatory Visit (INDEPENDENT_AMBULATORY_CARE_PROVIDER_SITE_OTHER): Payer: Medicare Other | Admitting: *Deleted

## 2016-08-30 DIAGNOSIS — Z9889 Other specified postprocedural states: Secondary | ICD-10-CM | POA: Diagnosis not present

## 2016-08-30 DIAGNOSIS — Z7901 Long term (current) use of anticoagulants: Secondary | ICD-10-CM

## 2016-08-30 DIAGNOSIS — I4891 Unspecified atrial fibrillation: Secondary | ICD-10-CM | POA: Diagnosis not present

## 2016-08-30 LAB — POCT INR: INR: 2.8

## 2016-09-04 ENCOUNTER — Other Ambulatory Visit: Payer: Self-pay | Admitting: Family Medicine

## 2016-10-02 ENCOUNTER — Other Ambulatory Visit: Payer: Self-pay | Admitting: Emergency Medicine

## 2016-10-02 MED ORDER — PRAVASTATIN SODIUM 20 MG PO TABS: 20.0000 mg | ORAL_TABLET | Freq: Every day | ORAL | 3 refills | Status: DC

## 2016-10-11 ENCOUNTER — Ambulatory Visit (INDEPENDENT_AMBULATORY_CARE_PROVIDER_SITE_OTHER): Payer: Medicare Other | Admitting: *Deleted

## 2016-10-11 DIAGNOSIS — I4891 Unspecified atrial fibrillation: Secondary | ICD-10-CM | POA: Diagnosis not present

## 2016-10-11 DIAGNOSIS — Z9889 Other specified postprocedural states: Secondary | ICD-10-CM | POA: Diagnosis not present

## 2016-10-11 DIAGNOSIS — Z7901 Long term (current) use of anticoagulants: Secondary | ICD-10-CM

## 2016-10-11 LAB — POCT INR: INR: 2.6

## 2016-11-03 ENCOUNTER — Other Ambulatory Visit: Payer: Self-pay | Admitting: Family Medicine

## 2016-11-03 NOTE — Telephone Encounter (Signed)
Pt called asking for a refill on her hydrocodone 5/325  Please call when ready.  Pt knows Dr. Darnell Level is out until Monday.  Thank sTeri

## 2016-11-06 MED ORDER — HYDROCODONE-ACETAMINOPHEN 5-325 MG PO TABS: ORAL_TABLET | ORAL | 0 refills | Status: DC

## 2016-11-13 ENCOUNTER — Encounter: Payer: Self-pay | Admitting: Family Medicine

## 2016-11-13 ENCOUNTER — Ambulatory Visit (INDEPENDENT_AMBULATORY_CARE_PROVIDER_SITE_OTHER): Payer: Medicare Other | Admitting: Family Medicine

## 2016-11-13 VITALS — BP 124/72 | HR 72 | Temp 97.5°F | Resp 16 | Wt 104.0 lb

## 2016-11-13 DIAGNOSIS — E7800 Pure hypercholesterolemia, unspecified: Secondary | ICD-10-CM

## 2016-11-13 DIAGNOSIS — R739 Hyperglycemia, unspecified: Secondary | ICD-10-CM | POA: Diagnosis not present

## 2016-11-13 DIAGNOSIS — I10 Essential (primary) hypertension: Secondary | ICD-10-CM | POA: Diagnosis not present

## 2016-11-13 DIAGNOSIS — I4891 Unspecified atrial fibrillation: Secondary | ICD-10-CM

## 2016-11-13 DIAGNOSIS — M810 Age-related osteoporosis without current pathological fracture: Secondary | ICD-10-CM

## 2016-11-13 LAB — POCT GLYCOSYLATED HEMOGLOBIN (HGB A1C): HEMOGLOBIN A1C: 6.1

## 2016-11-13 NOTE — Progress Notes (Signed)
Subjective:  HPI  Hypertension, follow-up:  BP Readings from Last 3 Encounters:  11/13/16 124/72  07/06/16 132/72  07/06/16 138/72    She was last seen for hypertension 4 months ago.  BP at that visit was 138/72. Management since that visit includes none. She reports good compliance with treatment. She is not having side effects.  She is not exercising. She is adherent to low salt diet.   Outside blood pressures are running "good". She is experiencing none.  Patient denies chest pain, chest pressure/discomfort, claudication, dyspnea, exertional chest pressure/discomfort, fatigue, irregular heart beat, lower extremity edema, near-syncope, orthopnea, palpitations, paroxysmal nocturnal dyspnea, syncope and tachypnea.   Cardiovascular risk factors include advanced age (older than 13 for men, 20 for women) and hypertension.  Wt Readings from Last 3 Encounters:  11/13/16 104 lb (47.2 kg)  07/06/16 105 lb (47.6 kg)  07/06/16 105 lb 12.8 oz (48 kg)    ------------------------------------------------------------------------ Hyperglycemia- last a1c was 6.2.    Prior to Admission medications   Medication Sig Start Date End Date Taking? Authorizing Provider  calcium-vitamin D (OSCAL 500/200 D-3) 500-200 MG-UNIT per tablet Take 1 tablet by mouth daily.      [provider]  DAILY MULTIPLE VITAMINS tablet Take 1 tablet by mouth daily.      [provider]  FERREX 150 150 MG capsule TAKE ONE CAPSULE BY MOUTH ONCE DAILY 07/03/16   Jerrol Banana., MD  furosemide (LASIX) 20 MG tablet TAKE ONE TABLET BY MOUTH ONCE DAILY 02/29/16   Minna Merritts, MD  HYDROcodone-acetaminophen (NORCO/VICODIN) 5-325 MG tablet 1/2 to 1 tablet twice a day as needed 11/06/16   Jerrol Banana., MD  KLOR-CON M20 20 MEQ tablet TAKE ONE TABLET BY MOUTH ONCE DAILY 02/29/16   Minna Merritts, MD  latanoprost (XALATAN) 0.005 % ophthalmic solution 1 drop at bedtime.      [provider]  levothyroxine (SYNTHROID, LEVOTHROID) 50 MCG tablet TAKE ONE TABLET BY MOUTH ONCE DAILY 04/11/16   Jerrol Banana., MD  lisinopril (PRINIVIL,ZESTRIL) 10 MG tablet TAKE ONE TABLET BY MOUTH ONCE DAILY 02/24/16   Jerrol Banana., MD  omeprazole (PRILOSEC) 20 MG capsule TAKE ONE CAPSULE BY MOUTH ONCE DAILY 04/11/16   Jerrol Banana., MD  pravastatin (PRAVACHOL) 20 MG tablet Take 1 tablet (20 mg total) by mouth daily. 10/02/16   Jerrol Banana., MD  timolol (TIMOPTIC) 0.5 % ophthalmic solution 1 drop.      [provider]  warfarin (COUMADIN) 3 MG tablet TAKE ONE TABLET BY MOUTH ONCE DAILY 09/05/16   Jerrol Banana., MD    Patient Active Problem List   Diagnosis Date Noted  . Aortic valve stenosis 12/07/2015  . SK (seborrheic keratosis) 07/05/2015  . Encounter for anticoagulation discussion and counseling 03/08/2015  . Absolute anemia 01/04/2015  . Heart valve replaced 01/04/2015  . Arthritis 01/04/2015  . Atherosclerosis of coronary artery 01/04/2015  . Bilateral cataracts 01/04/2015  . DD (diverticular disease) 01/04/2015  . Bleeding from the nose 01/04/2015  . Essential (primary) hypertension 01/04/2015  . Glaucoma 01/04/2015  . Acid reflux 01/04/2015  . Borderline diabetes 01/04/2015  . H/O acute myocardial infarction 01/04/2015  . H/O gastric ulcer 01/04/2015  . Hypothyroidism 01/04/2015  . Arthritis, degenerative 01/04/2015  . OP (osteoporosis) 01/04/2015  . Post menopausal syndrome 01/04/2015  . Basal cell papilloma 01/04/2015  . Heart valve disease 01/04/2015  . Osteoarthritis 10/16/2014  .  Current use of long term anticoagulation 04/25/2012  . Hyperlipidemia 01/14/2010  . HYPERTENSION, BENIGN 01/14/2010  . CAD, NATIVE VESSEL 01/14/2010  . Atrial fibrillation ([place]) 01/14/2010  . HEART VALVE REPLACEMENT, HX OF 01/14/2010  . Hypercholesteremia 12/11/2006  . Chest pain, precordial 12/11/2006    Past Medical History:    Diagnosis Date  . Arthritis   . Atrial fibrillation ([place])   . Congestive heart failure ([place])   . Hyperlipidemia   . Hypertension   . Rheumatic fever   . S/P aortic valve replacement    [facility]    Social History   Social History  . Marital status: Widowed    Spouse name: N/A  . Number of children: N/A  . Years of education: N/A   Occupational History  . Not on file.   Social History Main Topics  . Smoking status: Never Smoker  . Smokeless tobacco: Never Used     Comment: tobacco use- no   . Alcohol use No  . Drug use: No  . Sexual activity: No   Other Topics Concern  . Not on file   Social History Narrative   Retired. Regularly exercises.     Allergies  Allergen Reactions  . Sulfa Antibiotics   . Sulfonamide Derivatives     Review of Systems  Constitutional: Negative.   HENT: Negative.   Eyes: Negative.   Respiratory: Negative.   Cardiovascular: Negative.   Gastrointestinal: Negative.   Genitourinary: Negative.   Musculoskeletal: Positive for joint pain.  Skin: Negative.   Neurological: Negative.   Endo/Heme/Allergies: Negative.   Psychiatric/Behavioral: Negative.     Immunization History  Administered Date(s) Administered  . Influenza, High Dose Seasonal PF 01/04/2015, 01/10/2016  . Pneumococcal Conjugate-13 12/23/2013  . Td 09/22/1993    Objective:  BP 124/72 (BP Location: Left Arm, Patient Position: Sitting, Cuff Size: Normal)   Pulse 72   Temp (!) 97.5 F (36.4 C) (Oral)   Resp 16   Wt 104 lb (47.2 kg)   BMI 20.31 kg/m   Physical Exam  Constitutional: She is oriented to person, place, and time and well-developed, well-nourished, and in no distress.  HENT:  Head: Normocephalic and atraumatic.  Right Ear: External ear normal.  Left Ear: External ear normal.  Nose: Nose normal.  Eyes: Pupils are equal, round, and reactive to light. Conjunctivae are normal.  Neck: Normal range of motion. Neck supple.  Cardiovascular: Normal rate,  regular rhythm, normal heart sounds and intact distal pulses.   Atrial fib, rate controlled.   Pulmonary/Chest: Effort normal and breath sounds normal.  Abdominal: Soft.  Musculoskeletal: Normal range of motion. She exhibits edema (trace.).  Neurological: She is alert and oriented to person, place, and time. She has normal reflexes. Gait normal. GCS score is 15.  Skin: Skin is warm and dry.  Psychiatric: Mood, memory, affect and judgment normal.    Lab Results  Component Value Date   WBC 10.5 04/15/2016   HGB 12.8 04/15/2016   HCT 36.8 04/15/2016   PLT 192 04/15/2016   GLUCOSE 173 (H) 04/15/2016   CHOL 136 01/10/2016   TRIG 54 01/10/2016   HDL 62 01/10/2016   LDLCALC 63 01/10/2016   TSH 1.900 01/10/2016   INR 2.6 10/11/2016   HGBA1C 6.2 07/06/2016    CMP     Component Value Date/Time   NA 136 04/15/2016 0116   NA 140 01/10/2016 1115   K 3.6 04/15/2016 0116   CL 102 04/15/2016 0116   CO2  25 04/15/2016 0116   GLUCOSE 173 (H) 04/15/2016 0116   BUN 21 (H) 04/15/2016 0116   BUN 19 01/10/2016 1115   CREATININE 0.82 04/15/2016 0116   CALCIUM 9.5 04/15/2016 0116   PROT 7.1 01/10/2016 1115   ALBUMIN 3.9 01/10/2016 1115   AST 29 01/10/2016 1115   ALT 17 01/10/2016 1115   ALKPHOS 74 01/10/2016 1115   BILITOT 0.8 01/10/2016 1115   GFRNONAA >60 04/15/2016 0116   GFRAA >60 04/15/2016 0116    Assessment and Plan :  1. Essential hypertension Stable. - CBC with Differential/Platelet - TSH  2. Pure hypercholesterolemia  - Lipid Panel With LDL/HDL Ratio - Comprehensive metabolic panel  3. Hyperglycemia  - POCT HgB A1C 6.1 today.   4. Atrial fibrillation, unspecified type ([place])   5. Osteoporosis, unspecified osteoporosis type, unspecified pathological fracture presence Pt declines BMD today.  6.OA   HPI, Exam, and A&P Transcribed under the direction and in the presence of Latina Frank L. Cranford Mon, MD  Electronically Signed: Katina Dung, [place]  MD [facility] 11/13/2016 10:02 AM

## 2016-11-14 LAB — COMPREHENSIVE METABOLIC PANEL
A/G RATIO: 1.2 (ref 1.2–2.2)
ALT: 17 IU/L (ref 0–32)
AST: 31 IU/L (ref 0–40)
Albumin: 4 g/dL (ref 3.2–4.6)
Alkaline Phosphatase: 75 IU/L (ref 39–117)
BILIRUBIN TOTAL: 1 mg/dL (ref 0.0–1.2)
BUN/Creatinine Ratio: 22 (ref 12–28)
BUN: 14 mg/dL (ref 10–36)
CHLORIDE: 101 mmol/L (ref 96–106)
CO2: 25 mmol/L (ref 20–29)
Calcium: 9.6 mg/dL (ref 8.7–10.3)
Creatinine, Ser: 0.64 mg/dL (ref 0.57–1.00)
GFR calc non Af Amer: 79 mL/min/{1.73_m2} (ref >59)
GFR, EST AFRICAN AMERICAN: 91 mL/min/{1.73_m2} (ref >59)
Globulin, Total: 3.3 g/dL (ref 1.5–4.5)
Glucose: 119 mg/dL — ABNORMAL HIGH (ref 65–99)
POTASSIUM: 4.3 mmol/L (ref 3.5–5.2)
Sodium: 137 mmol/L (ref 134–144)
TOTAL PROTEIN: 7.3 g/dL (ref 6.0–8.5)

## 2016-11-14 LAB — LIPID PANEL WITH LDL/HDL RATIO
Cholesterol, Total: 134 mg/dL (ref 100–199)
HDL: 58 mg/dL (ref >39)
LDL Calculated: 60 mg/dL (ref 0–99)
LDL/HDL RATIO: 1 ratio (ref 0.0–3.2)
Triglycerides: 80 mg/dL (ref 0–149)
VLDL CHOLESTEROL CAL: 16 mg/dL (ref 5–40)

## 2016-11-14 LAB — CBC WITH DIFFERENTIAL/PLATELET
BASOS: 0 %
Basophils Absolute: 0 10*3/uL (ref 0.0–0.2)
EOS (ABSOLUTE): 0.1 10*3/uL (ref 0.0–0.4)
EOS: 2 %
HEMATOCRIT: 35.1 % (ref 34.0–46.6)
HEMOGLOBIN: 11.9 g/dL (ref 11.1–15.9)
IMMATURE GRANS (ABS): 0 10*3/uL (ref 0.0–0.1)
IMMATURE GRANULOCYTES: 0 %
LYMPHS: 21 %
Lymphocytes Absolute: 1.2 10*3/uL (ref 0.7–3.1)
MCH: 33.7 pg — ABNORMAL HIGH (ref 26.6–33.0)
MCHC: 33.9 g/dL (ref 31.5–35.7)
MCV: 99 fL — AB (ref 79–97)
Monocytes Absolute: 0.5 10*3/uL (ref 0.1–0.9)
Monocytes: 8 %
NEUTROS PCT: 69 %
Neutrophils Absolute: 3.9 10*3/uL (ref 1.4–7.0)
Platelets: 176 10*3/uL (ref 150–379)
RBC: 3.53 x10E6/uL — ABNORMAL LOW (ref 3.77–5.28)
RDW: 14 % (ref 12.3–15.4)
WBC: 5.6 10*3/uL (ref 3.4–10.8)

## 2016-11-14 LAB — TSH: TSH: 1.8 u[IU]/mL (ref 0.450–4.500)

## 2016-11-22 ENCOUNTER — Ambulatory Visit (INDEPENDENT_AMBULATORY_CARE_PROVIDER_SITE_OTHER): Payer: Medicare Other

## 2016-11-22 DIAGNOSIS — I4891 Unspecified atrial fibrillation: Secondary | ICD-10-CM

## 2016-11-22 DIAGNOSIS — Z9889 Other specified postprocedural states: Secondary | ICD-10-CM

## 2016-11-22 DIAGNOSIS — Z7901 Long term (current) use of anticoagulants: Secondary | ICD-10-CM | POA: Diagnosis not present

## 2016-11-22 LAB — POCT INR: INR: 3

## 2016-12-08 ENCOUNTER — Other Ambulatory Visit: Payer: Self-pay | Admitting: Cardiovascular Disease

## 2016-12-14 DIAGNOSIS — H40153 Residual stage of open-angle glaucoma, bilateral: Secondary | ICD-10-CM | POA: Diagnosis not present

## 2017-01-03 ENCOUNTER — Ambulatory Visit (INDEPENDENT_AMBULATORY_CARE_PROVIDER_SITE_OTHER): Payer: Medicare Other

## 2017-01-03 DIAGNOSIS — Z7901 Long term (current) use of anticoagulants: Secondary | ICD-10-CM | POA: Diagnosis not present

## 2017-01-03 DIAGNOSIS — I4891 Unspecified atrial fibrillation: Secondary | ICD-10-CM

## 2017-01-03 DIAGNOSIS — Z9889 Other specified postprocedural states: Secondary | ICD-10-CM

## 2017-01-03 LAB — POCT INR: INR: 3.1

## 2017-01-31 ENCOUNTER — Ambulatory Visit (INDEPENDENT_AMBULATORY_CARE_PROVIDER_SITE_OTHER): Payer: Medicare Other

## 2017-01-31 DIAGNOSIS — I4891 Unspecified atrial fibrillation: Secondary | ICD-10-CM | POA: Diagnosis not present

## 2017-01-31 DIAGNOSIS — Z7901 Long term (current) use of anticoagulants: Secondary | ICD-10-CM

## 2017-01-31 DIAGNOSIS — Z9889 Other specified postprocedural states: Secondary | ICD-10-CM

## 2017-01-31 LAB — POCT INR: INR: 2.3

## 2017-02-14 ENCOUNTER — Other Ambulatory Visit: Payer: Self-pay | Admitting: Family Medicine

## 2017-02-26 ENCOUNTER — Other Ambulatory Visit: Payer: Self-pay | Admitting: Cardiovascular Disease

## 2017-03-07 ENCOUNTER — Other Ambulatory Visit: Payer: Self-pay | Admitting: Family Medicine

## 2017-03-07 NOTE — Telephone Encounter (Signed)
Pt contacted office for refill request on the following medications:  HYDROcodone-acetaminophen (NORCO/VICODIN) 5-325 MG tablet   CB#[phone]/MW

## 2017-03-08 MED ORDER — HYDROCODONE-ACETAMINOPHEN 5-325 MG PO TABS: 0.5000 | ORAL_TABLET | Freq: Two times a day (BID) | ORAL | 0 refills | Status: DC | PRN

## 2017-03-08 NOTE — Telephone Encounter (Signed)
Pt advised.

## 2017-03-08 NOTE — Telephone Encounter (Signed)
Please review for dr gilbert-Anastasiya V Hopkins, RMA  

## 2017-03-08 NOTE — Telephone Encounter (Signed)
Please let patient know that Rx will be available to pick up.  Virginia Crews, MD, MPH [facility] 03/08/2017 8:30 AM

## 2017-03-13 NOTE — Progress Notes (Signed)
Cardiology Office Note  Date:  03/14/2017   ID:  Wannetta, Langland 1923-12-10, MRN [id number]  PCP:  Jerrol Banana., MD   Chief Complaint  Patient presents with  . other    12 month follow up. Meds reviewed by the pt. verbally. "doing well."     HPI:  Ms. Nitta is a very pleasant 83 year old woman with a history of  rheumatic valve disease, [employer] Jude aortic valve replacement in 1991,  chronic atrial fibrillation on warfarin,   severe tricuspid regurgitation on echocardiogram dating back to 2008,   mild to moderate pulmonary hypertension (RVSP estimated at 46),  She has been stable on Lasix daily. who presents for routine followup of her atrial fibrillation and prosthetic valve  .   In follow-up she reports that she feels well She presents with family No shortness of breath, no significant leg swelling Continues on Lasix with potassium daily No PND orthopnea Denies any chest pain on exertion  Walks with a cane, no falls, no regular exercise program Blood pressure well controlled at home  she lost her daughter to cancer ,  Colon cancer with metastases to the lung.  Lab work reviewed Total chol 134, LDL 60  EKG personally reviewed by myself on todays visit Shows atrial fibrillation with ventricular rate 87 bpm  Other past medical history reviewed Echocardiogram from August 2017 reviewed with her showing moderate aortic valve stenosis, severe tricuspid valve regurgitation, moderate MR   echo was in early 2014 . Normal ejection fraction, severe TR, well-seated aortic valve .  No falls. No complications on warfarin  Cardiac catheterization in August 2008 showed mild to moderate distal left main disease, moderate ostial left circumflex disease, mild ostial LAD and ostial B1 disease, severe disease of the PL range medical management was recommended.   In the past, she has had difficulty tolerating Lipitor, Now on low-dose pravastatin Total cholesterol 166, LDL  85, HDL 65  PMH:   has a past medical history of Arthritis, Atrial fibrillation ([place]), Congestive heart failure ([place]), Hyperlipidemia, Hypertension, Rheumatic fever, and S/P aortic valve replacement.  PSH:    Past Surgical History:  Procedure Laterality Date  . AORTIC VALVE REPLACEMENT    . UPPER GI ENDOSCOPY     01/09/08 normal esophagus, normal duodenum, non-bleeding erosive gastropathy    Current Outpatient Medications  Medication Sig Dispense Refill  . calcium-vitamin D (OSCAL 500/200 D-3) 500-200 MG-UNIT per tablet Take 1 tablet by mouth daily.      [name] DAILY MULTIPLE VITAMINS tablet Take 1 tablet by mouth daily.      [name] FERREX 150 150 MG capsule TAKE ONE CAPSULE BY MOUTH ONCE DAILY 30 capsule 12  . furosemide (LASIX) 20 MG tablet TAKE ONE TABLET BY MOUTH ONCE DAILY 90 tablet 3  . HYDROcodone-acetaminophen (NORCO/VICODIN) 5-325 MG tablet Take 0.5-1 tablets 2 (two) times daily as needed by mouth for moderate pain. 60 tablet 0  . KLOR-CON M20 20 MEQ tablet TAKE ONE TABLET BY MOUTH ONCE DAILY 90 tablet 3  . latanoprost (XALATAN) 0.005 % ophthalmic solution 1 drop at bedtime.      [name] levothyroxine (SYNTHROID, LEVOTHROID) 50 MCG tablet TAKE ONE TABLET BY MOUTH ONCE DAILY 90 tablet 3  . lisinopril (PRINIVIL,ZESTRIL) 10 MG tablet TAKE ONE TABLET BY MOUTH ONCE DAILY 90 tablet 3  . omeprazole (PRILOSEC) 20 MG capsule TAKE ONE CAPSULE BY MOUTH ONCE DAILY 90 capsule 3  . pravastatin (PRAVACHOL) 20 MG tablet Take 1 tablet (20 mg  total) by mouth daily. 90 tablet 3  . timolol (TIMOPTIC) 0.5 % ophthalmic solution 1 drop.      [name] warfarin (COUMADIN) 3 MG tablet TAKE ONE TABLET BY MOUTH ONCE DAILY 30 tablet 5   No current facility-administered medications for this visit.      Allergies:   Sulfa antibiotics and Sulfonamide derivatives   Social History:  The patient  reports that  has never smoked. she has never used smokeless tobacco. She reports that she does not drink alcohol or use drugs.    Family History:   family history includes Arthritis in her brother and father; Cancer in her brother and daughter; Cerebral palsy in her daughter; Diabetes in her sister; Heart attack in her brother, father, and sister; Heart disease in her mother; Hypertension in her mother.    Review of Systems: Review of Systems  Constitutional: Negative.   Respiratory: Positive for shortness of breath.   Cardiovascular: Negative.   Gastrointestinal: Negative.   Musculoskeletal: Negative.   Neurological: Negative.   Psychiatric/Behavioral: Negative.   All other systems reviewed and are negative.    PHYSICAL EXAM: VS:  BP (!) 150/70 (BP Location: Right Arm, Patient Position: Sitting, Cuff Size: Normal)   Pulse 87   Ht 4\' 10"  (1.473 m)   Wt 100 lb 8 oz (45.6 kg)   BMI 21.00 kg/m  , BMI Body mass index is 21 kg/m.  No significant change in exam GEN: Well nourished, well developed, in no acute distress  HEENT: normal  Neck: no JVD, carotid bruits, or masses CardiacIrregularly irregular,2+ SEM RSB,  no rubs, or gallops,no edema  Respiratory:  clear to auscultation bilaterally, normal work of breathing GI: soft, nontender, nondistended, + BS MS: no deformity or atrophy  Skin: warm and dry, no rash Neuro:  Strength and sensation are intact Psych: euthymic mood, full affect    Recent Labs: 11/13/2016: ALT 17; BUN 14; Creatinine, Ser 0.64; Hemoglobin 11.9; Platelets 176; Potassium 4.3; Sodium 137; TSH 1.800    Lipid Panel Lab Results  Component Value Date   CHOL 134 11/13/2016   HDL 58 11/13/2016   LDLCALC 60 11/13/2016   TRIG 80 11/13/2016      Wt Readings from Last 3 Encounters:  03/14/17 100 lb 8 oz (45.6 kg)  11/13/16 104 lb (47.2 kg)  07/06/16 105 lb (47.6 kg)       ASSESSMENT AND PLAN:  S/P AVR (aortic valve replacement) - Moderate aortic valve stenosis, prosthetic valve is more than 81 years old asymptomatic Repeat echo in follow-up  Atrial fibrillation with RVR  ([place]) - Plan: ECHOCARDIOGRAM COMPLETE tolerating anticoagulation Rate controlled  SOB (shortness of breath) - Plan: ECHOCARDIOGRAM COMPLETE denies significant shortness of breath. Lasix daily. Extra Lasix for shortness of breath, abdominal bloating or leg swelling  Tricuspid valve regurgitation Severe tricuspid valve regurgitation , chronic issue Previously not interested in any surgical options and as she is not very symptomatic  Aortic valve stenosis Moderate stenosis of prosthetic valve Asymptomatic Repeat echocardiogram and follow-up   Total encounter time more than 25 minutes  Greater than 50% was spent in counseling and coordination of care with the patient  Disposition:   F/U  12 months   No orders of the defined types were placed in this encounter.    Signed, Esmond Plants, M.D., Ph.D. 03/14/2017  [place], [place]

## 2017-03-14 ENCOUNTER — Ambulatory Visit (INDEPENDENT_AMBULATORY_CARE_PROVIDER_SITE_OTHER): Payer: Medicare Other

## 2017-03-14 ENCOUNTER — Ambulatory Visit: Payer: Medicare Other | Admitting: Cardiovascular Disease

## 2017-03-14 ENCOUNTER — Encounter: Payer: Self-pay | Admitting: Cardiovascular Disease

## 2017-03-14 ENCOUNTER — Other Ambulatory Visit: Payer: Self-pay | Admitting: Family Medicine

## 2017-03-14 VITALS — BP 150/70 | HR 87 | Ht <= 58 in | Wt 100.5 lb

## 2017-03-14 DIAGNOSIS — I10 Essential (primary) hypertension: Secondary | ICD-10-CM

## 2017-03-14 DIAGNOSIS — R072 Precordial pain: Secondary | ICD-10-CM | POA: Diagnosis not present

## 2017-03-14 DIAGNOSIS — I350 Nonrheumatic aortic (valve) stenosis: Secondary | ICD-10-CM

## 2017-03-14 DIAGNOSIS — Z7189 Other specified counseling: Secondary | ICD-10-CM

## 2017-03-14 DIAGNOSIS — I4891 Unspecified atrial fibrillation: Secondary | ICD-10-CM

## 2017-03-14 DIAGNOSIS — Z9889 Other specified postprocedural states: Secondary | ICD-10-CM

## 2017-03-14 DIAGNOSIS — I25118 Atherosclerotic heart disease of native coronary artery with other forms of angina pectoris: Secondary | ICD-10-CM

## 2017-03-14 DIAGNOSIS — Z7901 Long term (current) use of anticoagulants: Secondary | ICD-10-CM

## 2017-03-14 DIAGNOSIS — Z23 Encounter for immunization: Secondary | ICD-10-CM | POA: Diagnosis not present

## 2017-03-14 LAB — POCT INR: INR: 2.4

## 2017-03-14 NOTE — Patient Instructions (Signed)
Continue on same dosage 1 tablet daily except 1/2 tablet on Mondays.   Recheck in 6 weeks.

## 2017-03-14 NOTE — Patient Instructions (Addendum)
Medication Instructions:   Take extra lasix as needed for shortness of breath, leg swelling, weight gain, bloating  Labwork:  No new labs needed  Testing/Procedures:  No further testing at this time   Follow-Up: It was a pleasure seeing you in the office today. Please call us if you have new issues that need to be addressed before your next appt.  [phone]  Your physician wants you to follow-up in: 12 months.  You will receive a reminder letter in the mail two months in advance. If you don't receive a letter, please call our office to schedule the follow-up appointment.  If you need a refill on your cardiac medications before your next appointment, please call your pharmacy.

## 2017-03-16 NOTE — Telephone Encounter (Signed)
Dr. Rosanna Randy,  She does not come here for her PT/INR anymore.  Should she be getting this from [place] or where she goes for PT. Thanks

## 2017-03-28 DIAGNOSIS — H40153 Residual stage of open-angle glaucoma, bilateral: Secondary | ICD-10-CM | POA: Diagnosis not present

## 2017-03-29 ENCOUNTER — Other Ambulatory Visit: Payer: Self-pay | Admitting: Family Medicine

## 2017-03-29 NOTE — Telephone Encounter (Signed)
Last OV: 11/13/16

## 2017-04-02 ENCOUNTER — Ambulatory Visit: Payer: Self-pay | Admitting: Family Medicine

## 2017-04-09 ENCOUNTER — Ambulatory Visit (INDEPENDENT_AMBULATORY_CARE_PROVIDER_SITE_OTHER): Payer: Medicare Other | Admitting: Family Medicine

## 2017-04-09 VITALS — BP 130/72 | HR 80 | Temp 97.8°F | Resp 16 | Wt 102.0 lb

## 2017-04-09 DIAGNOSIS — E039 Hypothyroidism, unspecified: Secondary | ICD-10-CM

## 2017-04-09 DIAGNOSIS — M810 Age-related osteoporosis without current pathological fracture: Secondary | ICD-10-CM

## 2017-04-09 DIAGNOSIS — I2510 Atherosclerotic heart disease of native coronary artery without angina pectoris: Secondary | ICD-10-CM

## 2017-04-09 DIAGNOSIS — I10 Essential (primary) hypertension: Secondary | ICD-10-CM | POA: Diagnosis not present

## 2017-04-09 DIAGNOSIS — M1990 Unspecified osteoarthritis, unspecified site: Secondary | ICD-10-CM

## 2017-04-09 DIAGNOSIS — R739 Hyperglycemia, unspecified: Secondary | ICD-10-CM | POA: Diagnosis not present

## 2017-04-09 DIAGNOSIS — E7800 Pure hypercholesterolemia, unspecified: Secondary | ICD-10-CM

## 2017-04-09 DIAGNOSIS — I4891 Unspecified atrial fibrillation: Secondary | ICD-10-CM

## 2017-04-09 NOTE — Progress Notes (Signed)
Patient: Kathy Mccall Female    DOB: 1923/12/17   83 y.o.   MRN: [id number] Visit Date: 04/09/2017  Today's Provider: Wilhemena Durie, MD   Chief Complaint  Patient presents with  . Hypertension  . Hyperlipidemia  . Hyperglycemia   Subjective:    HPI  Hypertension, follow-up:  BP Readings from Last 3 Encounters:  04/09/17 130/72  03/14/17 (!) 150/70  11/13/16 124/72    She was last seen for hypertension 6 months ago.  BP at that visit was 150/70. Management since that visit includes none. She reports good compliance with treatment. She is not having side effects.  She is not exercising. She is adherent to low salt diet.   Outside blood pressures are checked occasionally. Patient denies chest pain, chest pressure/discomfort, claudication, dyspnea, exertional chest pressure/discomfort, fatigue, irregular heart beat, lower extremity edema, near-syncope, orthopnea, palpitations, paroxysmal nocturnal dyspnea, syncope and tachypnea.    Wt Readings from Last 3 Encounters:  04/09/17 102 lb (46.3 kg)  03/14/17 100 lb 8 oz (45.6 kg)  11/13/16 104 lb (47.2 kg)    ------------------------------------------------------------------------   Pre-Diabetes, Follow-up:   Lab Results  Component Value Date   HGBA1C 6.1 11/13/2016   HGBA1C 6.2 07/06/2016   HGBA1C 6.1 (H) 01/10/2016    Last seen for pre diabetes 5 months ago.  Management since then includes none.    Pertinent Labs:    Component Value Date/Time   CHOL 134 11/13/2016 1031   TRIG 80 11/13/2016 1031   HDL 58 11/13/2016 1031   LDLCALC 60 11/13/2016 1031   CREATININE 0.64 11/13/2016 1031    Wt Readings from Last 3 Encounters:  04/09/17 102 lb (46.3 kg)  03/14/17 100 lb 8 oz (45.6 kg)  11/13/16 104 lb (47.2 kg)    ------------------------------------------------------------------------ Pt declines a1c today.         Allergies  Allergen Reactions  . Sulfa Antibiotics   . Sulfonamide  Derivatives      Current Outpatient Medications:  .  calcium-vitamin D (OSCAL 500/200 D-3) 500-200 MG-UNIT per tablet, Take 1 tablet by mouth daily.  , Disp: , Rfl:  .  DAILY MULTIPLE VITAMINS tablet, Take 1 tablet by mouth daily.  , Disp: , Rfl:  .  FERREX 150 150 MG capsule, TAKE ONE CAPSULE BY MOUTH ONCE DAILY, Disp: 30 capsule, Rfl: 12 .  furosemide (LASIX) 20 MG tablet, TAKE ONE TABLET BY MOUTH ONCE DAILY, Disp: 90 tablet, Rfl: 3 .  HYDROcodone-acetaminophen (NORCO/VICODIN) 5-325 MG tablet, Take 0.5-1 tablets 2 (two) times daily as needed by mouth for moderate pain., Disp: 60 tablet, Rfl: 0 .  KLOR-CON M20 20 MEQ tablet, TAKE ONE TABLET BY MOUTH ONCE DAILY, Disp: 90 tablet, Rfl: 3 .  latanoprost (XALATAN) 0.005 % ophthalmic solution, 1 drop at bedtime.  , Disp: , Rfl:  .  levothyroxine (SYNTHROID, LEVOTHROID) 50 MCG tablet, TAKE ONE TABLET BY MOUTH ONCE DAILY, Disp: 90 tablet, Rfl: 3 .  lisinopril (PRINIVIL,ZESTRIL) 10 MG tablet, TAKE ONE TABLET BY MOUTH ONCE DAILY, Disp: 90 tablet, Rfl: 3 .  omeprazole (PRILOSEC) 20 MG capsule, TAKE ONE CAPSULE BY MOUTH ONCE DAILY, Disp: 90 capsule, Rfl: 3 .  pravastatin (PRAVACHOL) 20 MG tablet, Take 1 tablet (20 mg total) by mouth daily., Disp: 90 tablet, Rfl: 3 .  timolol (TIMOPTIC) 0.5 % ophthalmic solution, 1 drop.  , Disp: , Rfl:  .  warfarin (COUMADIN) 3 MG tablet, TAKE 1 TABLET BY MOUTH ONCE DAILY, Disp:  30 tablet, Rfl: 5  Review of Systems  Constitutional: Negative.   HENT: Negative.   Eyes: Negative.   Respiratory: Negative.   Cardiovascular: Negative.   Gastrointestinal: Negative.   Endocrine: Negative.   Genitourinary: Negative.   Musculoskeletal: Negative.   Skin: Negative.   Allergic/Immunologic: Negative.   Neurological: Negative.   Hematological: Negative.   Psychiatric/Behavioral: Negative.     Social History   Tobacco Use  . Smoking status: Never Smoker  . Smokeless tobacco: Never Used  . Tobacco comment: tobacco use-  no   Substance Use Topics  . Alcohol use: No   Objective:   BP 130/72 (BP Location: Left Arm, Patient Position: Sitting, Cuff Size: Normal)   Pulse 80   Temp 97.8 F (36.6 C) (Oral)   Resp 16   Wt 102 lb (46.3 kg)   BMI 21.32 kg/m  Vitals:   04/09/17 0929  BP: 130/72  Pulse: 80  Resp: 16  Temp: 97.8 F (36.6 C)  TempSrc: Oral  Weight: 102 lb (46.3 kg)     Physical Exam  Constitutional: She is oriented to person, place, and time. She appears well-developed and well-nourished.  Elderly,cachectic WF NAD  HENT:  Head: Normocephalic and atraumatic.  Eyes: Conjunctivae and EOM are normal. Pupils are equal, round, and reactive to light.  Neck: Normal range of motion. Neck supple.  Cardiovascular: Normal rate, regular rhythm and intact distal pulses.  Murmur (irregular 3/6 systolic murmur) heard. Pulmonary/Chest: Effort normal and breath sounds normal.  Abdominal: Soft.  Musculoskeletal:  She ambulates with a cane.  Neurological: She is alert and oriented to person, place, and time.  Skin: Skin is warm and dry.  Psychiatric: She has a normal mood and affect. Her behavior is normal. Judgment and thought content normal.        Assessment & Plan:     1. Essential hypertension   2. Pure hypercholesterolemia   3. Hyperglycemia Declined a1c today  4. Atrial fibrillation, unspecified type ([place]) 5.OA Avoid NSAIDs      HPI, Exam, and A&P Transcribed under the direction and in the presence of Eurydice Calixto L. Cranford Mon, MD  Electronically Signed: Katina Dung, CMA  I have done the exam and reviewed the above chart and it is accurate to the best of my knowledge. Development worker, community has been used in this note in any air is in the dictation or transcription are unintentional.  Wilhemena Durie, MD  [place]

## 2017-04-17 ENCOUNTER — Encounter: Payer: Self-pay | Admitting: Family Medicine

## 2017-04-25 ENCOUNTER — Ambulatory Visit (INDEPENDENT_AMBULATORY_CARE_PROVIDER_SITE_OTHER): Payer: Medicare Other

## 2017-04-25 DIAGNOSIS — Z7901 Long term (current) use of anticoagulants: Secondary | ICD-10-CM

## 2017-04-25 DIAGNOSIS — Z9889 Other specified postprocedural states: Secondary | ICD-10-CM | POA: Diagnosis not present

## 2017-04-25 DIAGNOSIS — I4891 Unspecified atrial fibrillation: Secondary | ICD-10-CM | POA: Diagnosis not present

## 2017-04-25 LAB — POCT INR: INR: 2.3

## 2017-04-25 NOTE — Patient Instructions (Signed)
Continue on same dosage 1 tablet daily except 1/2 tablet on Mondays.   Recheck in 6 weeks.

## 2017-06-06 ENCOUNTER — Ambulatory Visit (INDEPENDENT_AMBULATORY_CARE_PROVIDER_SITE_OTHER): Payer: Medicare Other

## 2017-06-06 ENCOUNTER — Telehealth: Payer: Self-pay | Admitting: Family Medicine

## 2017-06-06 DIAGNOSIS — I4891 Unspecified atrial fibrillation: Secondary | ICD-10-CM

## 2017-06-06 DIAGNOSIS — Z9889 Other specified postprocedural states: Secondary | ICD-10-CM | POA: Diagnosis not present

## 2017-06-06 DIAGNOSIS — Z7901 Long term (current) use of anticoagulants: Secondary | ICD-10-CM

## 2017-06-06 LAB — POCT INR: INR: 2.6

## 2017-06-06 NOTE — Patient Instructions (Signed)
Continue on same dosage 1 tablet daily except 1/2 tablet on Mondays.   Recheck in 6 weeks.

## 2017-06-14 DIAGNOSIS — H0012 Chalazion right lower eyelid: Secondary | ICD-10-CM | POA: Diagnosis not present

## 2017-07-04 ENCOUNTER — Other Ambulatory Visit: Payer: Self-pay | Admitting: Family Medicine

## 2017-07-16 ENCOUNTER — Other Ambulatory Visit: Payer: Self-pay | Admitting: Family Medicine

## 2017-07-16 MED ORDER — HYDROCODONE-ACETAMINOPHEN 5-325 MG PO TABS: 0.5000 | ORAL_TABLET | Freq: Two times a day (BID) | ORAL | 0 refills | Status: DC | PRN

## 2017-07-16 NOTE — Telephone Encounter (Signed)
Pt requesting a refill of Hydrocodone 5-325 sent to [facility] on [address], pt requesting call back once the RX is sent in.

## 2017-07-18 ENCOUNTER — Ambulatory Visit (INDEPENDENT_AMBULATORY_CARE_PROVIDER_SITE_OTHER): Payer: Medicare Other

## 2017-07-18 DIAGNOSIS — I4891 Unspecified atrial fibrillation: Secondary | ICD-10-CM | POA: Diagnosis not present

## 2017-07-18 DIAGNOSIS — Z9889 Other specified postprocedural states: Secondary | ICD-10-CM | POA: Diagnosis not present

## 2017-07-18 DIAGNOSIS — Z7901 Long term (current) use of anticoagulants: Secondary | ICD-10-CM | POA: Diagnosis not present

## 2017-07-18 LAB — POCT INR: INR: 2.8

## 2017-07-18 NOTE — Patient Instructions (Signed)
Continue on same dosage 1 tablet daily except 1/2 tablet on Mondays.    Recheck in 6 weeks.

## 2017-07-26 NOTE — Telephone Encounter (Signed)
complete

## 2017-07-30 DIAGNOSIS — H40153 Residual stage of open-angle glaucoma, bilateral: Secondary | ICD-10-CM | POA: Diagnosis not present

## 2017-08-29 ENCOUNTER — Ambulatory Visit (INDEPENDENT_AMBULATORY_CARE_PROVIDER_SITE_OTHER): Payer: Medicare Other

## 2017-08-29 DIAGNOSIS — I4891 Unspecified atrial fibrillation: Secondary | ICD-10-CM | POA: Diagnosis not present

## 2017-08-29 DIAGNOSIS — Z7901 Long term (current) use of anticoagulants: Secondary | ICD-10-CM | POA: Diagnosis not present

## 2017-08-29 DIAGNOSIS — Z9889 Other specified postprocedural states: Secondary | ICD-10-CM

## 2017-08-29 LAB — POCT INR: INR: 3.2

## 2017-08-29 NOTE — Patient Instructions (Signed)
Please take 1/2 tablet tonight, then continue on same dosage 1 tablet daily except 1/2 tablet on Mondays.    Recheck in 6 weeks.

## 2017-09-26 ENCOUNTER — Other Ambulatory Visit: Payer: Self-pay | Admitting: Family Medicine

## 2017-09-27 NOTE — Telephone Encounter (Signed)
FYI-she does her PT/INR at cardiology

## 2017-10-08 ENCOUNTER — Ambulatory Visit (INDEPENDENT_AMBULATORY_CARE_PROVIDER_SITE_OTHER): Payer: Medicare Other | Admitting: Family Medicine

## 2017-10-08 VITALS — BP 144/70 | HR 92 | Temp 97.9°F | Resp 14 | Wt 97.0 lb

## 2017-10-08 DIAGNOSIS — E440 Moderate protein-calorie malnutrition: Secondary | ICD-10-CM | POA: Diagnosis not present

## 2017-10-08 DIAGNOSIS — E039 Hypothyroidism, unspecified: Secondary | ICD-10-CM

## 2017-10-08 DIAGNOSIS — I10 Essential (primary) hypertension: Secondary | ICD-10-CM

## 2017-10-08 DIAGNOSIS — G629 Polyneuropathy, unspecified: Secondary | ICD-10-CM

## 2017-10-08 DIAGNOSIS — E538 Deficiency of other specified B group vitamins: Secondary | ICD-10-CM | POA: Diagnosis not present

## 2017-10-08 DIAGNOSIS — E7800 Pure hypercholesterolemia, unspecified: Secondary | ICD-10-CM | POA: Diagnosis not present

## 2017-10-08 DIAGNOSIS — R7303 Prediabetes: Secondary | ICD-10-CM | POA: Diagnosis not present

## 2017-10-08 MED ORDER — HYDROCODONE-ACETAMINOPHEN 5-325 MG PO TABS: 0.5000 | ORAL_TABLET | Freq: Two times a day (BID) | ORAL | 0 refills | Status: DC | PRN

## 2017-10-08 NOTE — Progress Notes (Signed)
Kathy Mccall  MRN: [id number] DOB: Sep 02, 1923  Subjective:  HPI   The patient is a 84 year old female who presents for 6 month follow up of chronic health.  She was last seen on 04/09/17.   She had routine labs on her last visit that included Met C, TSH, Lipids and CBC.  All were relatively WNL for her.  Hypertension-Her blood pressure was stable on the last visit.    BP Readings from Last 3 Encounters:  10/08/17 (!) 144/70  04/09/17 130/72  03/14/17 (!) 150/70   Hyperglycemia-The patient declined having her A1C checked on the last visit.  However in July 2018 it was 6.1.  Atrial Fibrillation-followed by cardiology where she has her PT/INR checked routinely.   Patient Active Problem List   Diagnosis Date Noted  . Aortic valve stenosis 12/07/2015  . SK (seborrheic keratosis) 07/05/2015  . Encounter for anticoagulation discussion and counseling 03/08/2015  . Absolute anemia 01/04/2015  . Heart valve replaced 01/04/2015  . Arthritis 01/04/2015  . Atherosclerosis of coronary artery 01/04/2015  . Bilateral cataracts 01/04/2015  . DD (diverticular disease) 01/04/2015  . Bleeding from the nose 01/04/2015  . Essential (primary) hypertension 01/04/2015  . Glaucoma 01/04/2015  . Acid reflux 01/04/2015  . Borderline diabetes 01/04/2015  . H/O acute myocardial infarction 01/04/2015  . H/O gastric ulcer 01/04/2015  . Hypothyroidism 01/04/2015  . Arthritis, degenerative 01/04/2015  . OP (osteoporosis) 01/04/2015  . Post menopausal syndrome 01/04/2015  . Basal cell papilloma 01/04/2015  . Heart valve disease 01/04/2015  . Osteoarthritis 10/16/2014  . Current use of long term anticoagulation 04/25/2012  . Hyperlipidemia 01/14/2010  . HYPERTENSION, BENIGN 01/14/2010  . CAD, NATIVE VESSEL 01/14/2010  . Atrial fibrillation ([place]) 01/14/2010  . HEART VALVE REPLACEMENT, HX OF 01/14/2010  . Hypercholesteremia 12/11/2006  . Chest pain, precordial 12/11/2006    Past Medical  History:  Diagnosis Date  . Arthritis   . Atrial fibrillation ([place])   . Congestive heart failure ([place])   . Hyperlipidemia   . Hypertension   . Rheumatic fever   . S/P aortic valve replacement    [facility]    Social History   Socioeconomic History  . Marital status: Widowed    Spouse name: Not on file  . Number of children: Not on file  . Years of education: Not on file  . Highest education level: Not on file  Occupational History  . Not on file  Social Needs  . Financial resource strain: Not on file  . Food insecurity:    Worry: Not on file    Inability: Not on file  . Transportation needs:    Medical: Not on file    Non-medical: Not on file  Tobacco Use  . Smoking status: Never Smoker  . Smokeless tobacco: Never Used  . Tobacco comment: tobacco use- no   Substance and Sexual Activity  . Alcohol use: No  . Drug use: No  . Sexual activity: Never  Lifestyle  . Physical activity:    Days per week: Not on file    Minutes per session: Not on file  . Stress: Not on file  Relationships  . Social connections:    Talks on phone: Not on file    Gets together: Not on file    Attends religious service: Not on file    Active member of club or organization: Not on file    Attends meetings of clubs or organizations: Not on file  Relationship status: Not on file  . Intimate partner violence:    Fear of current or ex partner: Not on file    Emotionally abused: Not on file    Physically abused: Not on file    Forced sexual activity: Not on file  Other Topics Concern  . Not on file  Social History Narrative   Retired. Regularly exercises.     Outpatient Encounter Medications as of 10/08/2017  Medication Sig  . calcium-vitamin D (OSCAL 500/200 D-3) 500-200 MG-UNIT per tablet Take 1 tablet by mouth daily.    [name] DAILY MULTIPLE VITAMINS tablet Take 1 tablet by mouth daily.    [name] FERREX 150 150 MG capsule TAKE 1 CAPSULE BY MOUTH ONCE DAILY  . furosemide (LASIX) 20 MG tablet  TAKE ONE TABLET BY MOUTH ONCE DAILY  . HYDROcodone-acetaminophen (NORCO/VICODIN) 5-325 MG tablet Take 0.5-1 tablets by mouth 2 (two) times daily as needed for moderate pain.  [name] KLOR-CON M20 20 MEQ tablet TAKE ONE TABLET BY MOUTH ONCE DAILY  . latanoprost (XALATAN) 0.005 % ophthalmic solution 1 drop at bedtime.    [name] levothyroxine (SYNTHROID, LEVOTHROID) 50 MCG tablet TAKE ONE TABLET BY MOUTH ONCE DAILY  . lisinopril (PRINIVIL,ZESTRIL) 10 MG tablet TAKE ONE TABLET BY MOUTH ONCE DAILY  . omeprazole (PRILOSEC) 20 MG capsule TAKE ONE CAPSULE BY MOUTH ONCE DAILY  . pravastatin (PRAVACHOL) 20 MG tablet TAKE 1 TABLET BY MOUTH ONCE DAILY  . timolol (TIMOPTIC) 0.5 % ophthalmic solution 1 drop.    [name] warfarin (COUMADIN) 3 MG tablet TAKE 1 TABLET BY MOUTH ONCE DAILY   No facility-administered encounter medications on file as of 10/08/2017.     Allergies  Allergen Reactions  . Sulfa Antibiotics   . Sulfonamide Derivatives     Review of Systems  Constitutional: Negative for fever, malaise/fatigue and weight loss.  HENT: Negative.   Eyes: Negative.   Respiratory: Negative for cough, shortness of breath and wheezing.   Cardiovascular: Positive for leg swelling. Negative for chest pain, palpitations and orthopnea.  Gastrointestinal: Negative.   Musculoskeletal: Positive for back pain and joint pain.  Skin: Negative.   Neurological: Negative for dizziness and headaches.  Endo/Heme/Allergies: Negative.   Psychiatric/Behavioral: Negative.     Objective:  BP (!) 144/70 (BP Location: Right Arm, Patient Position: Sitting, Cuff Size: Normal)   Pulse 92   Temp 97.9 F (36.6 C) (Oral)   Resp 14   Wt 97 lb (44 kg)   SpO2 97%   BMI 20.27 kg/m   Physical Exam  Constitutional: She is oriented to person, place, and time and well-developed, well-nourished, and in no distress.  Cachective WF NAD.  HENT:  Head: Normocephalic and atraumatic.  Right Ear: External ear normal.  Left Ear: External ear  normal.  Nose: Nose normal.  Eyes: Conjunctivae are normal. No scleral icterus.  Neck: No thyromegaly present.  Cardiovascular: Normal rate, regular rhythm and normal heart sounds.  Pulmonary/Chest: Effort normal and breath sounds normal.  Abdominal: Soft.  Musculoskeletal: She exhibits no edema.  Neurological: She is alert and oriented to person, place, and time. Gait normal. GCS score is 15.  Skin: Skin is warm and dry.  Psychiatric: Memory, affect and judgment normal.    Assessment and Plan :  1. Essential (primary) hypertension  - CBC with Differential/Platelet - Comprehensive metabolic panel - TSH  2. Hypothyroidism, unspecified type  - TSH  3. Hypercholesteremia  - Lipid Panel With LDL/HDL Ratio  4. Borderline diabetes  - Comprehensive metabolic  panel - Hemoglobin A1c  5. Malnutrition of moderate degree (HCC)  - Comprehensive metabolic panel - Lipid Panel With LDL/HDL Ratio  6. Neuropathy  - CBC with Differential/Platelet - Comprehensive metabolic panel - Z76  7. B12 deficiency  - B12  I have done the exam and reviewed the chart and it is accurate to the best of my knowledge. Development worker, community has been used and  any errors in dictation or transcription are unintentional. Miguel Aschoff M.D. [facility]

## 2017-10-08 NOTE — Patient Instructions (Signed)
Wear support hose during the day

## 2017-10-09 LAB — VITAMIN B12: Vitamin B-12: 543 pg/mL (ref 232–1245)

## 2017-10-09 LAB — CBC WITH DIFFERENTIAL/PLATELET
BASOS: 0 %
Basophils Absolute: 0 10*3/uL (ref 0.0–0.2)
EOS (ABSOLUTE): 0.1 10*3/uL (ref 0.0–0.4)
Eos: 1 %
Hematocrit: 33.9 % — ABNORMAL LOW (ref 34.0–46.6)
Hemoglobin: 11.2 g/dL (ref 11.1–15.9)
IMMATURE GRANS (ABS): 0 10*3/uL (ref 0.0–0.1)
Immature Granulocytes: 0 %
LYMPHS: 22 %
Lymphocytes Absolute: 1.1 10*3/uL (ref 0.7–3.1)
MCH: 33.8 pg — AB (ref 26.6–33.0)
MCHC: 33 g/dL (ref 31.5–35.7)
MCV: 102 fL — ABNORMAL HIGH (ref 79–97)
MONOS ABS: 0.5 10*3/uL (ref 0.1–0.9)
Monocytes: 11 %
NEUTROS ABS: 3.2 10*3/uL (ref 1.4–7.0)
Neutrophils: 66 %
PLATELETS: 184 10*3/uL (ref 150–450)
RBC: 3.31 x10E6/uL — ABNORMAL LOW (ref 3.77–5.28)
RDW: 13.6 % (ref 12.3–15.4)
WBC: 4.8 10*3/uL (ref 3.4–10.8)

## 2017-10-09 LAB — TSH: TSH: 2.27 u[IU]/mL (ref 0.450–4.500)

## 2017-10-09 LAB — LIPID PANEL WITH LDL/HDL RATIO
CHOLESTEROL TOTAL: 124 mg/dL (ref 100–199)
HDL: 55 mg/dL (ref >39)
LDL CALC: 57 mg/dL (ref 0–99)
LDL/HDL RATIO: 1 ratio (ref 0.0–3.2)
Triglycerides: 59 mg/dL (ref 0–149)
VLDL Cholesterol Cal: 12 mg/dL (ref 5–40)

## 2017-10-09 LAB — COMPREHENSIVE METABOLIC PANEL
A/G RATIO: 1.2 (ref 1.2–2.2)
ALT: 17 IU/L (ref 0–32)
AST: 28 IU/L (ref 0–40)
Albumin: 3.9 g/dL (ref 3.2–4.6)
Alkaline Phosphatase: 80 IU/L (ref 39–117)
BILIRUBIN TOTAL: 0.8 mg/dL (ref 0.0–1.2)
BUN / CREAT RATIO: 25 (ref 12–28)
BUN: 16 mg/dL (ref 10–36)
CHLORIDE: 102 mmol/L (ref 96–106)
CO2: 24 mmol/L (ref 20–29)
Calcium: 9.8 mg/dL (ref 8.7–10.3)
Creatinine, Ser: 0.64 mg/dL (ref 0.57–1.00)
GFR calc non Af Amer: 78 mL/min/{1.73_m2} (ref >59)
GFR, EST AFRICAN AMERICAN: 90 mL/min/{1.73_m2} (ref >59)
GLOBULIN, TOTAL: 3.3 g/dL (ref 1.5–4.5)
Glucose: 139 mg/dL — ABNORMAL HIGH (ref 65–99)
POTASSIUM: 4.2 mmol/L (ref 3.5–5.2)
SODIUM: 136 mmol/L (ref 134–144)
TOTAL PROTEIN: 7.2 g/dL (ref 6.0–8.5)

## 2017-10-09 LAB — HEMOGLOBIN A1C
Est. average glucose Bld gHb Est-mCnc: 143 mg/dL
Hgb A1c MFr Bld: 6.6 % — ABNORMAL HIGH (ref 4.8–5.6)

## 2017-10-09 NOTE — Progress Notes (Signed)
Advised  ED 

## 2017-10-10 ENCOUNTER — Ambulatory Visit (INDEPENDENT_AMBULATORY_CARE_PROVIDER_SITE_OTHER): Payer: Medicare Other

## 2017-10-10 DIAGNOSIS — Z7901 Long term (current) use of anticoagulants: Secondary | ICD-10-CM | POA: Diagnosis not present

## 2017-10-10 DIAGNOSIS — Z9889 Other specified postprocedural states: Secondary | ICD-10-CM | POA: Diagnosis not present

## 2017-10-10 DIAGNOSIS — I4891 Unspecified atrial fibrillation: Secondary | ICD-10-CM

## 2017-10-10 LAB — POCT INR: INR: 3.2 — AB (ref 2.0–3.0)

## 2017-10-10 NOTE — Patient Instructions (Signed)
Please skip coumadin  tonight, then continue on same dosage 1 tablet daily except 1/2 tablet on Mondays.    Recheck in 6 weeks.

## 2017-11-19 ENCOUNTER — Other Ambulatory Visit: Payer: Self-pay | Admitting: Cardiovascular Disease

## 2017-11-21 ENCOUNTER — Ambulatory Visit (INDEPENDENT_AMBULATORY_CARE_PROVIDER_SITE_OTHER): Payer: Medicare Other

## 2017-11-21 ENCOUNTER — Telehealth: Payer: Self-pay | Admitting: Family Medicine

## 2017-11-21 DIAGNOSIS — Z9889 Other specified postprocedural states: Secondary | ICD-10-CM | POA: Diagnosis not present

## 2017-11-21 DIAGNOSIS — I4891 Unspecified atrial fibrillation: Secondary | ICD-10-CM | POA: Diagnosis not present

## 2017-11-21 DIAGNOSIS — Z7901 Long term (current) use of anticoagulants: Secondary | ICD-10-CM

## 2017-11-21 LAB — POCT INR: INR: 3.2 — AB (ref 2.0–3.0)

## 2017-11-21 NOTE — Telephone Encounter (Signed)
I called the pt to schedule AWV w/ [facility], but the line was busy. VDM (DD)

## 2017-11-21 NOTE — Patient Instructions (Signed)
Please START NEW DOSAGEof dosage 1 tablet daily except 1/2 TABLET ON MONDAYS, [place]. Recheck in 4 weeks.

## 2017-11-29 DIAGNOSIS — H40153 Residual stage of open-angle glaucoma, bilateral: Secondary | ICD-10-CM | POA: Diagnosis not present

## 2017-12-03 ENCOUNTER — Ambulatory Visit: Payer: Self-pay

## 2017-12-14 ENCOUNTER — Ambulatory Visit (INDEPENDENT_AMBULATORY_CARE_PROVIDER_SITE_OTHER): Payer: Medicare Other

## 2017-12-14 VITALS — BP 122/52 | HR 93 | Temp 99.0°F | Ht <= 58 in | Wt 97.8 lb

## 2017-12-14 DIAGNOSIS — Z0000 Encounter for general adult medical examination without abnormal findings: Secondary | ICD-10-CM

## 2017-12-14 NOTE — Progress Notes (Signed)
Subjective:   Kathy Mccall is a 84 y.o. female who presents for Medicare Annual (Subsequent) preventive examination.  Review of Systems:  N/A  Cardiac Risk Factors include: advanced age (>52men, >58 women);dyslipidemia;hypertension     Objective:     Vitals: BP (!) 122/52 (BP Location: Right Arm)   Pulse 93   Temp 99 F (37.2 C) (Oral)   Ht 4\' 10"  (1.473 m)   Wt 97 lb 12.8 oz (44.4 kg)   BMI 20.44 kg/m   Body mass index is 20.44 kg/m.  Advanced Directives 12/14/2017 07/06/2016 04/14/2016 01/10/2016 07/06/2015  Does Patient Have a Medical Advance Directive? Yes No No No No  Type of Paramedic of [place];Living will - - - -  Copy of [place] in Chart? No - copy requested - - - -  Would patient like information on creating a medical advance directive? - No - Patient declined - - -    Tobacco Social History   Tobacco Use  Smoking Status Never Smoker  Smokeless Tobacco Never Used  Tobacco Comment   tobacco use- no      Counseling given: Not Answered Comment: tobacco use- no    Clinical Intake:  Pre-visit preparation completed: Yes  Pain : No/denies pain Pain Score: 0-No pain     Nutritional Status: BMI of 19-24  Normal Nutritional Risks: None Diabetes: No  How often do you need to have someone help you when you read instructions, pamphlets, or other written materials from your doctor or pharmacy?: 1 - Never  Interpreter Needed?: No  Information entered by :: [facility], LPN  Past Medical History:  Diagnosis Date  . Arthritis   . Atrial fibrillation ([place])   . Congestive heart failure ([place])   . Hyperlipidemia   . Hypertension   . Rheumatic fever   . S/P aortic valve replacement    [facility]   Past Surgical History:  Procedure Laterality Date  . AORTIC VALVE REPLACEMENT    . UPPER GI ENDOSCOPY     01/09/08 normal esophagus, normal duodenum, non-bleeding erosive gastropathy   Family History  Problem  Relation Age of Onset  . Heart disease Mother   . Hypertension Mother   . Arthritis Father   . Heart attack Father   . Heart attack Sister   . Diabetes Sister   . Cancer Brother   . Heart attack Brother   . Arthritis Brother   . Cancer Daughter        Colon cancer  . Cerebral palsy Daughter    Social History   Socioeconomic History  . Marital status: Widowed    Spouse name: Not on file  . Number of children: 2  . Years of education: Not on file  . Highest education level: 7th grade  Occupational History  . Not on file  Social Needs  . Financial resource strain: Not hard at all  . Food insecurity:    Worry: Never true    Inability: Never true  . Transportation needs:    Medical: No    Non-medical: No  Tobacco Use  . Smoking status: Never Smoker  . Smokeless tobacco: Never Used  . Tobacco comment: tobacco use- no   Substance and Sexual Activity  . Alcohol use: No  . Drug use: No  . Sexual activity: Never  Lifestyle  . Physical activity:    Days per week: Not on file    Minutes per session: Not on file  .  Stress: Not at all  Relationships  . Social connections:    Talks on phone: Not on file    Gets together: Not on file    Attends religious service: Not on file    Active member of club or organization: Not on file    Attends meetings of clubs or organizations: Not on file    Relationship status: Not on file  Other Topics Concern  . Not on file  Social History Narrative   Retired. Regularly exercises.     Outpatient Encounter Medications as of 12/14/2017  Medication Sig  . calcium-vitamin D (OSCAL 500/200 D-3) 500-200 MG-UNIT per tablet Take 1 tablet by mouth daily.    [name] DAILY MULTIPLE VITAMINS tablet Take 1 tablet by mouth daily.    [name] FERREX 150 150 MG capsule TAKE 1 CAPSULE BY MOUTH ONCE DAILY  . furosemide (LASIX) 20 MG tablet TAKE 1 TABLET BY MOUTH ONCE DAILY  . KLOR-CON M20 20 MEQ tablet TAKE ONE TABLET BY MOUTH ONCE DAILY  . latanoprost (XALATAN)  0.005 % ophthalmic solution 1 drop at bedtime.    [name] levothyroxine (SYNTHROID, LEVOTHROID) 50 MCG tablet TAKE ONE TABLET BY MOUTH ONCE DAILY  . lisinopril (PRINIVIL,ZESTRIL) 10 MG tablet TAKE ONE TABLET BY MOUTH ONCE DAILY  . omeprazole (PRILOSEC) 20 MG capsule TAKE ONE CAPSULE BY MOUTH ONCE DAILY  . pravastatin (PRAVACHOL) 20 MG tablet TAKE 1 TABLET BY MOUTH ONCE DAILY  . timolol (TIMOPTIC) 0.5 % ophthalmic solution 1 drop.    [name] warfarin (COUMADIN) 3 MG tablet TAKE 1 TABLET BY MOUTH ONCE DAILY  . HYDROcodone Bitartrate 20 MG C12A Take 0.5 tablets by mouth daily.  [name] HYDROcodone-acetaminophen (NORCO/VICODIN) 5-325 MG tablet Take 0.5-1 tablets by mouth 2 (two) times daily as needed for moderate pain.   No facility-administered encounter medications on file as of 12/14/2017.     Activities of Daily Living In your present state of health, do you have any difficulty performing the following activities: 12/14/2017 10/08/2017  Hearing? Tempie Donning  Comment Wears bilateral hearing aids.  -  Vision? N N  Difficulty concentrating or making decisions? Y N  Walking or climbing stairs? Y Y  Comment Due to leg pain when climbing steps.  Patient uses a cane  Dressing or bathing? N N  Doing errands, shopping? N N  Preparing Food and eating ? Y -  Comment Does not cook.  -  Using the Toilet? N -  In the past six months, have you accidently leaked urine? N -  Do you have problems with loss of bowel control? N -  Managing your Medications? N -  Managing your Finances? Y -  Comment Granddaughter manages finances. -  Housekeeping or managing your Housekeeping? N -  Some recent data might be hidden    Patient Care Team: Jerrol Banana., MD as PCP - General (Unknown Physician Specialty) Minna Merritts, MD as Consulting Physician (Cardiology) Lorelee Cover., MD as Consulting Physician (Ophthalmology)    Assessment:   This is a routine wellness examination for Kathy Mccall.  Exercise Activities and  Dietary recommendations Current Exercise Habits: The patient does not participate in regular exercise at present, Exercise limited by: None identified  Goals    . Increase water intake     Recommend drinking 4-5 glasses of water a day.       Fall Risk Fall Risk  12/14/2017 10/08/2017 07/06/2016 07/06/2015 01/04/2015  Falls in the past year? No No No No No  Is the patient's home free of loose throw rugs in walkways, pet beds, electrical cords, etc?   yes      Grab bars in the bathroom? yes      Handrails on the stairs?   no      Adequate lighting?   yes  Timed Get Up and Go performed: N/A  Depression Screen PHQ 2/9 Scores 12/14/2017 10/08/2017 07/06/2016 07/06/2015  PHQ - 2 Score 0 0 1 0  PHQ- 9 Score - 0 - -     Cognitive Function     6CIT Screen 12/14/2017 07/06/2016  What Year? 0 points 0 points  What month? 0 points 0 points  What time? 0 points 0 points  Count back from 20 0 points 0 points  Months in reverse 4 points 0 points  Repeat phrase 4 points 8 points  Total Score 8 8    Immunization History  Administered Date(s) Administered  . Influenza, High Dose Seasonal PF 02/10/2014, 01/04/2015, 01/10/2016  . Influenza,inj,Quad PF,6+ Mos 12/30/2012, 03/14/2017  . Pneumococcal Conjugate-13 12/23/2013  . Td 09/22/1993    Qualifies for Shingles Vaccine? Due for Shingles vaccine. Declined my offer to administer today. Education has been provided regarding the importance of this vaccine. Pt has been advised to call her insurance company to determine her out of pocket expense. Advised she may also receive this vaccine at her local pharmacy or Health Dept. Verbalized acceptance and understanding. =  Screening Tests Health Maintenance  Topic Date Due  . INFLUENZA VACCINE  11/22/2017  . TETANUS/TDAP  04/24/2026 (Originally 09/23/2003)  . DEXA SCAN  Completed  . PNA vac Low Risk Adult  Completed    Cancer Screenings: Lung: Low Dose CT Chest recommended if Age 54-80 years, 30  pack-year currently smoking OR have quit w/in 15years. Patient does not qualify. Breast:  Up to date on Mammogram? Yes   Up to date of Bone Density/Dexa? Yes Colorectal: Up to date  Additional Screenings:  Hepatitis C Screening: N/A     Plan:  I have personally reviewed and addressed the Medicare Annual Wellness questionnaire and have noted the following in the patient's chart:  A. Medical and social history B. Use of alcohol, tobacco or illicit drugs  C. Current medications and supplements D. Functional ability and status E.  Nutritional status F.  Physical activity G. Advance directives H. List of other physicians I.  Hospitalizations, surgeries, and ER visits in previous 12 months J.  [place] such as hearing and vision if needed, cognitive and depression L. Referrals and appointments - none  In addition, I have reviewed and discussed with patient certain preventive protocols, quality metrics, and best practice recommendations. A written personalized care plan for preventive services as well as general preventive health recommendations were provided to patient.  See attached scanned questionnaire for additional information.   Signed,  Fabio Neighbors, LPN Nurse Health Advisor   Nurse Recommendations: Pt declines the tetanus vaccine today.

## 2017-12-14 NOTE — Patient Instructions (Signed)
[name] , Thank you for taking time to come for your Medicare Wellness Visit. I appreciate your ongoing commitment to your health goals. Please review the following plan we discussed and let me know if I can assist you in the future.   Screening recommendations/referrals: Colonoscopy: Up to date Mammogram: Up to date Bone Density: Up to date Recommended yearly ophthalmology/optometry visit for glaucoma screening and checkup Recommended yearly dental visit for hygiene and checkup  Vaccinations: Influenza vaccine: Up to date Pneumococcal vaccine: Up to date Tdap vaccine: Pt declines today.  Shingles vaccine: Pt declines today.     Advanced directives: Please bring a copy of your POA (Power of Attorney) and/or Living Will to your next appointment.   Conditions/risks identified: Recommend to continue working on increasing water intake to 4-6 glasses a day.   Next appointment: 02/07/18 with Dr Rosanna Randy.    Preventive Care 82 Years and Older, Female Preventive care refers to lifestyle choices and visits with your health care provider that can promote health and wellness. What does preventive care include?  A yearly physical exam. This is also called an annual well check.  Dental exams once or twice a year.  Routine eye exams. Ask your health care provider how often you should have your eyes checked.  Personal lifestyle choices, including:  Daily care of your teeth and gums.  Regular physical activity.  Eating a healthy diet.  Avoiding tobacco and drug use.  Limiting alcohol use.  Practicing safe sex.  Taking low-dose aspirin every day.  Taking vitamin and mineral supplements as recommended by your health care provider. What happens during an annual well check? The services and screenings done by your health care provider during your annual well check will depend on your age, overall health, lifestyle risk factors, and family history of disease. Counseling  Your  health care provider may ask you questions about your:  Alcohol use.  Tobacco use.  Drug use.  Emotional well-being.  Home and relationship well-being.  Sexual activity.  Eating habits.  History of falls.  Memory and ability to understand (cognition).  Work and work Statistician.  Reproductive health. Screening  You may have the following tests or measurements:  Height, weight, and BMI.  Blood pressure.  Lipid and cholesterol levels. These may be checked every 5 years, or more frequently if you are over 52 years old.  Skin check.  Lung cancer screening. You may have this screening every year starting at age 70 if you have a 30-pack-year history of smoking and currently smoke or have quit within the past 15 years.  Fecal occult blood test (FOBT) of the stool. You may have this test every year starting at age 52.  Flexible sigmoidoscopy or colonoscopy. You may have a sigmoidoscopy every 5 years or a colonoscopy every 10 years starting at age 44.  Hepatitis C blood test.  Hepatitis B blood test.  Sexually transmitted disease (STD) testing.  Diabetes screening. This is done by checking your blood sugar (glucose) after you have not eaten for a while (fasting). You may have this done every 1-3 years.  Bone density scan. This is done to screen for osteoporosis. You may have this done starting at age 56.  Mammogram. This may be done every 1-2 years. Talk to your health care provider about how often you should have regular mammograms. Talk with your health care provider about your test results, treatment options, and if necessary, the need for more tests. Vaccines  Your health care  provider may recommend certain vaccines, such as:  Influenza vaccine. This is recommended every year.  Tetanus, diphtheria, and acellular pertussis (Tdap, Td) vaccine. You may need a Td booster every 10 years.  Zoster vaccine. You may need this after age 47.  Pneumococcal 13-valent  conjugate (PCV13) vaccine. One dose is recommended after age 31.  Pneumococcal polysaccharide (PPSV23) vaccine. One dose is recommended after age 50. Talk to your health care provider about which screenings and vaccines you need and how often you need them. This information is not intended to replace advice given to you by your health care provider. Make sure you discuss any questions you have with your health care provider. Document Released: 05/07/2015 Document Revised: 12/29/2015 Document Reviewed: 02/09/2015 Elsevier Interactive Patient Education  2017 [place] Prevention in the Home Falls can cause injuries. They can happen to people of all ages. There are many things you can do to make your home safe and to help prevent falls. What can I do on the outside of my home?  Regularly fix the edges of walkways and driveways and fix any cracks.  Remove anything that might make you trip as you walk through a door, such as a raised step or threshold.  Trim any bushes or trees on the path to your home.  Use bright outdoor lighting.  Clear any walking paths of anything that might make someone trip, such as rocks or tools.  Regularly check to see if handrails are loose or broken. Make sure that both sides of any steps have handrails.  Any raised decks and porches should have guardrails on the edges.  Have any leaves, snow, or ice cleared regularly.  Use sand or salt on walking paths during winter.  Clean up any spills in your garage right away. This includes oil or grease spills. What can I do in the bathroom?  Use night lights.  Install grab bars by the toilet and in the tub and shower. Do not use towel bars as grab bars.  Use non-skid mats or decals in the tub or shower.  If you need to sit down in the shower, use a plastic, non-slip stool.  Keep the floor dry. Clean up any water that spills on the floor as soon as it happens.  Remove soap buildup in the tub or  shower regularly.  Attach bath mats securely with double-sided non-slip rug tape.  Do not have throw rugs and other things on the floor that can make you trip. What can I do in the bedroom?  Use night lights.  Make sure that you have a light by your bed that is easy to reach.  Do not use any sheets or blankets that are too big for your bed. They should not hang down onto the floor.  Have a firm chair that has side arms. You can use this for support while you get dressed.  Do not have throw rugs and other things on the floor that can make you trip. What can I do in the kitchen?  Clean up any spills right away.  Avoid walking on wet floors.  Keep items that you use a lot in easy-to-reach places.  If you need to reach something above you, use a strong step stool that has a grab bar.  Keep electrical cords out of the way.  Do not use floor polish or wax that makes floors slippery. If you must use wax, use non-skid floor wax.  Do not have throw  rugs and other things on the floor that can make you trip. What can I do with my stairs?  Do not leave any items on the stairs.  Make sure that there are handrails on both sides of the stairs and use them. Fix handrails that are broken or loose. Make sure that handrails are as long as the stairways.  Check any carpeting to make sure that it is firmly attached to the stairs. Fix any carpet that is loose or worn.  Avoid having throw rugs at the top or bottom of the stairs. If you do have throw rugs, attach them to the floor with carpet tape.  Make sure that you have a light switch at the top of the stairs and the bottom of the stairs. If you do not have them, ask someone to add them for you. What else can I do to help prevent falls?  Wear shoes that:  Do not have high heels.  Have rubber bottoms.  Are comfortable and fit you well.  Are closed at the toe. Do not wear sandals.  If you use a stepladder:  Make sure that it is fully  opened. Do not climb a closed stepladder.  Make sure that both sides of the stepladder are locked into place.  Ask someone to hold it for you, if possible.  Clearly mark and make sure that you can see:  Any grab bars or handrails.  First and last steps.  Where the edge of each step is.  Use tools that help you move around (mobility aids) if they are needed. These include:  Canes.  Walkers.  Scooters.  Crutches.  Turn on the lights when you go into a dark area. Replace any light bulbs as soon as they burn out.  Set up your furniture so you have a clear path. Avoid moving your furniture around.  If any of your floors are uneven, fix them.  If there are any pets around you, be aware of where they are.  Review your medicines with your doctor. Some medicines can make you feel dizzy. This can increase your chance of falling. Ask your doctor what other things that you can do to help prevent falls. This information is not intended to replace advice given to you by your health care provider. Make sure you discuss any questions you have with your health care provider. Document Released: 02/04/2009 Document Revised: 09/16/2015 Document Reviewed: 05/15/2014 Elsevier Interactive Patient Education  2017 [employer].

## 2017-12-19 ENCOUNTER — Ambulatory Visit (INDEPENDENT_AMBULATORY_CARE_PROVIDER_SITE_OTHER): Payer: Medicare Other

## 2017-12-19 DIAGNOSIS — Z9889 Other specified postprocedural states: Secondary | ICD-10-CM

## 2017-12-19 DIAGNOSIS — I4891 Unspecified atrial fibrillation: Secondary | ICD-10-CM

## 2017-12-19 DIAGNOSIS — Z7901 Long term (current) use of anticoagulants: Secondary | ICD-10-CM | POA: Diagnosis not present

## 2017-12-19 LAB — POCT INR: INR: 2.2 (ref 2.0–3.0)

## 2017-12-19 NOTE — Patient Instructions (Signed)
Please continue of dosage 1 tablet daily except 1/2 TABLET ON MONDAYS, [place]. Recheck in 5 weeks.

## 2018-01-23 ENCOUNTER — Ambulatory Visit (INDEPENDENT_AMBULATORY_CARE_PROVIDER_SITE_OTHER): Payer: Medicare Other

## 2018-01-23 DIAGNOSIS — Z9889 Other specified postprocedural states: Secondary | ICD-10-CM

## 2018-01-23 DIAGNOSIS — I4891 Unspecified atrial fibrillation: Secondary | ICD-10-CM | POA: Diagnosis not present

## 2018-01-23 DIAGNOSIS — Z7901 Long term (current) use of anticoagulants: Secondary | ICD-10-CM | POA: Diagnosis not present

## 2018-01-23 LAB — POCT INR: INR: 2.8 (ref 2.0–3.0)

## 2018-01-23 NOTE — Patient Instructions (Signed)
Please continue of dosage 1 tablet daily except 1/2 TABLET ON MONDAYS, [place]. Recheck in 6 weeks.

## 2018-02-07 ENCOUNTER — Ambulatory Visit: Payer: Self-pay | Admitting: Family Medicine

## 2018-02-07 ENCOUNTER — Other Ambulatory Visit: Payer: Self-pay | Admitting: Family Medicine

## 2018-02-18 ENCOUNTER — Other Ambulatory Visit: Payer: Self-pay | Admitting: Cardiovascular Disease

## 2018-02-21 ENCOUNTER — Encounter: Payer: Self-pay | Admitting: Family Medicine

## 2018-02-21 ENCOUNTER — Ambulatory Visit (INDEPENDENT_AMBULATORY_CARE_PROVIDER_SITE_OTHER): Payer: Medicare Other | Admitting: Family Medicine

## 2018-02-21 VITALS — BP 122/68 | HR 82 | Temp 98.0°F | Resp 16 | Wt 91.0 lb

## 2018-02-21 DIAGNOSIS — Z23 Encounter for immunization: Secondary | ICD-10-CM | POA: Diagnosis not present

## 2018-02-21 DIAGNOSIS — R7303 Prediabetes: Secondary | ICD-10-CM

## 2018-02-21 DIAGNOSIS — I10 Essential (primary) hypertension: Secondary | ICD-10-CM | POA: Diagnosis not present

## 2018-02-21 DIAGNOSIS — E440 Moderate protein-calorie malnutrition: Secondary | ICD-10-CM

## 2018-02-21 DIAGNOSIS — F329 Major depressive disorder, single episode, unspecified: Secondary | ICD-10-CM | POA: Diagnosis not present

## 2018-02-21 DIAGNOSIS — F32A Depression, unspecified: Secondary | ICD-10-CM

## 2018-02-21 DIAGNOSIS — E7800 Pure hypercholesterolemia, unspecified: Secondary | ICD-10-CM | POA: Diagnosis not present

## 2018-02-21 LAB — POCT GLYCOSYLATED HEMOGLOBIN (HGB A1C): Hemoglobin A1C: 6.8 % — AB (ref 4.0–5.6)

## 2018-02-21 MED ORDER — MIRTAZAPINE 30 MG PO TABS: 30.0000 mg | ORAL_TABLET | Freq: Every day | ORAL | 11 refills | Status: DC

## 2018-02-21 NOTE — Progress Notes (Signed)
Patient: Kathy Mccall Female    DOB: 06-14-1923   84 y.o.   MRN: [id number] Visit Date: 02/21/2018  Today's Provider: Wilhemena Durie, MD   Chief Complaint  Patient presents with  . Hypertension  . Hyperglycemia  . Weight Loss   Subjective:    HPI   Hypertension, follow-up:  BP Readings from Last 3 Encounters:  02/21/18 122/68  12/14/17 (!) 122/52  10/08/17 (!) 144/70    She was last seen for hypertension 4 months ago.  BP at that visit was 122/52. Management since that visit includes no changes. She reports good compliance with treatment. She is not having side effects.  She is not exercising. She is adherent to low salt diet.   Outside blood pressures are checked occasionally. She is experiencing none.  Patient denies exertional chest pressure/discomfort, lower extremity edema and palpitations.   Cardiovascular risk factors include dyslipidemia.    Prediabetes, Follow-up:   Lab Results  Component Value Date   HGBA1C 6.8 (A) 02/21/2018   HGBA1C 6.6 (H) 10/08/2017   HGBA1C 6.1 11/13/2016   GLUCOSE 139 (H) 10/08/2017   GLUCOSE 119 (H) 11/13/2016   GLUCOSE 173 (H) 04/15/2016    Last seen for for this4 months ago.  Management since that visit includes no changes. Current symptoms include none and have been stable.  Weight loss Patient's granddaughter mentions that the patient has lost at least 10lbs since last office visit. Patient reports that she has not been able to eat since the death of a loved one and she has no appetite.  Wt Readings from Last 3 Encounters:  02/21/18 91 lb (41.3 kg)  12/14/17 97 lb 12.8 oz (44.4 kg)  10/08/17 97 lb (44 kg)   Depression screen [facility] 2/9 02/21/2018 12/14/2017 10/08/2017  Decreased Interest 1 0 0  Down, Depressed, Hopeless 1 0 0  PHQ - 2 Score 2 0 0  Altered sleeping 3 - 0  Tired, decreased energy 2 - 0  Change in appetite 3 - 0  Feeling bad or failure about yourself  0 - 0  Trouble concentrating 0 - 0   Moving slowly or fidgety/restless 0 - 0  Suicidal thoughts 0 - 0  PHQ-9 Score 10 - 0  Difficult doing work/chores Not difficult at all - Not difficult at all     Allergies  Allergen Reactions  . Sulfa Antibiotics   . Sulfonamide Derivatives      Current Outpatient Medications:  .  calcium-vitamin D (OSCAL 500/200 D-3) 500-200 MG-UNIT per tablet, Take 1 tablet by mouth daily.  , Disp: , Rfl:  .  DAILY MULTIPLE VITAMINS tablet, Take 1 tablet by mouth daily.  , Disp: , Rfl:  .  FERREX 150 150 MG capsule, TAKE 1 CAPSULE BY MOUTH ONCE DAILY, Disp: 30 capsule, Rfl: 12 .  furosemide (LASIX) 20 MG tablet, TAKE 1 TABLET BY MOUTH ONCE DAILY, Disp: 90 tablet, Rfl: 3 .  HYDROcodone Bitartrate 20 MG C12A, Take 0.5 tablets by mouth daily., Disp: , Rfl:  .  HYDROcodone-acetaminophen (NORCO/VICODIN) 5-325 MG tablet, Take 0.5-1 tablets by mouth 2 (two) times daily as needed for moderate pain., Disp: 60 tablet, Rfl: 0 .  latanoprost (XALATAN) 0.005 % ophthalmic solution, 1 drop at bedtime.  , Disp: , Rfl:  .  levothyroxine (SYNTHROID, LEVOTHROID) 50 MCG tablet, TAKE ONE TABLET BY MOUTH ONCE DAILY, Disp: 90 tablet, Rfl: 3 .  lisinopril (PRINIVIL,ZESTRIL) 10 MG tablet, TAKE 1 TABLET BY MOUTH  ONCE DAILY, Disp: 90 tablet, Rfl: 3 .  omeprazole (PRILOSEC) 20 MG capsule, TAKE ONE CAPSULE BY MOUTH ONCE DAILY, Disp: 90 capsule, Rfl: 3 .  potassium chloride SA (K-DUR,KLOR-CON) 20 MEQ tablet, TAKE 1 TABLET BY MOUTH ONCE DAILY, Disp: 30 tablet, Rfl: 0 .  pravastatin (PRAVACHOL) 20 MG tablet, TAKE 1 TABLET BY MOUTH ONCE DAILY, Disp: 90 tablet, Rfl: 3 .  timolol (TIMOPTIC) 0.5 % ophthalmic solution, 1 drop.  , Disp: , Rfl:  .  warfarin (COUMADIN) 3 MG tablet, TAKE 1 TABLET BY MOUTH ONCE DAILY, Disp: 90 tablet, Rfl: 1  Review of Systems  Constitutional: Negative.   Respiratory: Negative.   Cardiovascular: Negative.   Endocrine: Negative.   Musculoskeletal: Negative.   Neurological: Negative.     Psychiatric/Behavioral: Negative.     Social History   Tobacco Use  . Smoking status: Never Smoker  . Smokeless tobacco: Never Used  . Tobacco comment: tobacco use- no   Substance Use Topics  . Alcohol use: No   Objective:   BP 122/68 (BP Location: Right Arm, Patient Position: Sitting, Cuff Size: Normal)   Pulse 82   Temp 98 F (36.7 C)   Resp 16   Wt 91 lb (41.3 kg)   SpO2 98%   BMI 19.02 kg/m  Vitals:   02/21/18 1611  BP: 122/68  Pulse: 82  Resp: 16  Temp: 98 F (36.7 C)  SpO2: 98%  Weight: 91 lb (41.3 kg)     Physical Exam  Constitutional: She is oriented to person, place, and time. She appears well-developed.  Cachectic WF NAD.  HENT:  Head: Normocephalic and atraumatic.  Right Ear: External ear normal.  Left Ear: External ear normal.  Nose: Nose normal.  Mouth/Throat: Oropharynx is clear and moist.  Eyes: Conjunctivae are normal. No scleral icterus.  Neck: No thyromegaly present.  Cardiovascular: Normal rate, regular rhythm and normal heart sounds.  Pulmonary/Chest: Effort normal and breath sounds normal.  Abdominal: Soft.  Musculoskeletal: She exhibits no edema.  Neurological: She is alert and oriented to person, place, and time.  Skin: Skin is warm and dry.  Psychiatric: She has a normal mood and affect. Her behavior is normal. Judgment and thought content normal.        Assessment & Plan:     1. Essential (primary) hypertension   2. Hypercholesteremia   3. Borderline diabetes New DM. - POCT glycosylated hemoglobin (Hb A1C)--6.8  4. Depression, unspecified depression type  - mirtazapine (REMERON) 30 MG tablet; Take 1 tablet (30 mg total) by mouth at bedtime.  Dispense: 30 tablet; Refill: 11  5. Flu vaccine need  - Flu vaccine HIGH DOSE PF (Fluzone High dose)  6. Malnutrition of moderate degree (HCC) Pt does not want aggressive follow up of issues with weight. - mirtazapine (REMERON) 30 MG tablet; Take 1 tablet (30 mg total) by  mouth at bedtime.  Dispense: 30 tablet; Refill: 11       I have done the exam and reviewed the above chart and it is accurate to the best of my knowledge. Development worker, community has been used in this note in any air is in the dictation or transcription are unintentional.  Wilhemena Durie, MD  [place]

## 2018-02-21 NOTE — Patient Instructions (Signed)
Stop hydrocodone.  Try Glycerna one shake daily.

## 2018-02-26 ENCOUNTER — Other Ambulatory Visit: Payer: Self-pay | Admitting: Family Medicine

## 2018-02-27 NOTE — Telephone Encounter (Signed)
Pharmacy requesting refills. Please review.

## 2018-03-06 ENCOUNTER — Ambulatory Visit (INDEPENDENT_AMBULATORY_CARE_PROVIDER_SITE_OTHER): Payer: Medicare Other

## 2018-03-06 DIAGNOSIS — Z9889 Other specified postprocedural states: Secondary | ICD-10-CM | POA: Diagnosis not present

## 2018-03-06 DIAGNOSIS — I4891 Unspecified atrial fibrillation: Secondary | ICD-10-CM | POA: Diagnosis not present

## 2018-03-06 DIAGNOSIS — Z7901 Long term (current) use of anticoagulants: Secondary | ICD-10-CM

## 2018-03-06 LAB — POCT INR: INR: 1.9 — AB (ref 2.0–3.0)

## 2018-03-06 NOTE — Patient Instructions (Signed)
Please take whole tablet tonight, then continue of dosage 1 tablet daily except 1/2 TABLET ON MONDAYS, [place]. Recheck in 6 weeks.

## 2018-03-24 ENCOUNTER — Other Ambulatory Visit: Payer: Self-pay | Admitting: Family Medicine

## 2018-03-24 ENCOUNTER — Other Ambulatory Visit: Payer: Self-pay | Admitting: Cardiovascular Disease

## 2018-04-03 DIAGNOSIS — H40153 Residual stage of open-angle glaucoma, bilateral: Secondary | ICD-10-CM | POA: Diagnosis not present

## 2018-04-04 ENCOUNTER — Ambulatory Visit (INDEPENDENT_AMBULATORY_CARE_PROVIDER_SITE_OTHER): Payer: Medicare Other | Admitting: Family Medicine

## 2018-04-04 VITALS — BP 138/68 | HR 70 | Temp 98.4°F | Resp 16 | Wt 91.0 lb

## 2018-04-04 DIAGNOSIS — I2510 Atherosclerotic heart disease of native coronary artery without angina pectoris: Secondary | ICD-10-CM

## 2018-04-04 DIAGNOSIS — F4320 Adjustment disorder, unspecified: Secondary | ICD-10-CM

## 2018-04-04 DIAGNOSIS — F321 Major depressive disorder, single episode, moderate: Secondary | ICD-10-CM | POA: Diagnosis not present

## 2018-04-04 DIAGNOSIS — I4891 Unspecified atrial fibrillation: Secondary | ICD-10-CM | POA: Diagnosis not present

## 2018-04-04 DIAGNOSIS — R634 Abnormal weight loss: Secondary | ICD-10-CM | POA: Diagnosis not present

## 2018-04-04 MED ORDER — PAROXETINE HCL 10 MG PO TABS: 10.0000 mg | ORAL_TABLET | Freq: Every day | ORAL | 11 refills | Status: DC

## 2018-04-04 NOTE — Progress Notes (Signed)
Patient: Kathy Mccall Female    DOB: November 11, 1923   84 y.o.   MRN: [id number] Visit Date: 04/04/2018  Today's Provider: Wilhemena Durie, MD   Chief Complaint  Patient presents with  . Weight Check   Subjective:     HPI Patient comes in today to follow up on weight loss. She was last seen in the office 1 month ago. She was started on Mirtazapine 30mg  daily. Patient reports that she could not tolerate the medication due to it causing nausea and diarrhea.  1 of her daughters is with her and I discussed with the patient and the daughter how far to go in evaluating and treating her anorexia and subsequent failure to thrive. Wt Readings from Last 3 Encounters:  04/04/18 91 lb (41.3 kg)  02/21/18 91 lb (41.3 kg)  12/14/17 97 lb 12.8 oz (44.4 kg)       Allergies  Allergen Reactions  . Sulfa Antibiotics   . Sulfonamide Derivatives      Current Outpatient Medications:  .  calcium-vitamin D (OSCAL 500/200 D-3) 500-200 MG-UNIT per tablet, Take 1 tablet by mouth daily.  , Disp: , Rfl:  .  DAILY MULTIPLE VITAMINS tablet, Take 1 tablet by mouth daily.  , Disp: , Rfl:  .  FERREX 150 150 MG capsule, TAKE 1 CAPSULE BY MOUTH ONCE DAILY, Disp: 30 capsule, Rfl: 12 .  furosemide (LASIX) 20 MG tablet, TAKE 1 TABLET BY MOUTH ONCE DAILY, Disp: 90 tablet, Rfl: 3 .  HYDROcodone Bitartrate 20 MG C12A, Take 0.5 tablets by mouth daily., Disp: , Rfl:  .  latanoprost (XALATAN) 0.005 % ophthalmic solution, 1 drop at bedtime.  , Disp: , Rfl:  .  levothyroxine (SYNTHROID, LEVOTHROID) 50 MCG tablet, TAKE 1 TABLET BY MOUTH ONCE DAILY, Disp: 90 tablet, Rfl: 3 .  lisinopril (PRINIVIL,ZESTRIL) 10 MG tablet, TAKE 1 TABLET BY MOUTH ONCE DAILY, Disp: 90 tablet, Rfl: 3 .  mirtazapine (REMERON) 30 MG tablet, Take 1 tablet (30 mg total) by mouth at bedtime., Disp: 30 tablet, Rfl: 11 .  omeprazole (PRILOSEC) 20 MG capsule, TAKE 1 CAPSULE BY MOUTH ONCE DAILY, Disp: 90 capsule, Rfl: 3 .  potassium chloride SA  (K-DUR,KLOR-CON) 20 MEQ tablet, TAKE 1 TABLET BY MOUTH ONCE DAILY, Disp: 30 tablet, Rfl: 1 .  pravastatin (PRAVACHOL) 20 MG tablet, TAKE 1 TABLET BY MOUTH ONCE DAILY, Disp: 90 tablet, Rfl: 3 .  timolol (TIMOPTIC) 0.5 % ophthalmic solution, 1 drop.  , Disp: , Rfl:  .  warfarin (COUMADIN) 3 MG tablet, TAKE 1 TABLET BY MOUTH ONCE DAILY, Disp: 90 tablet, Rfl: 1  Review of Systems  Constitutional: Positive for appetite change.  HENT: Negative.   Eyes: Negative.   Respiratory: Negative.  Negative for cough and shortness of breath.   Cardiovascular: Negative.  Negative for chest pain, palpitations and leg swelling.  Endocrine: Negative.   Musculoskeletal: Negative.   Allergic/Immunologic: Negative.   Neurological: Negative.   Psychiatric/Behavioral: Positive for dysphoric mood.       She is been somewhat depressed and and  has had anhedonia since the death of 1 of her daughters a couple of years ago    Social History   Tobacco Use  . Smoking status: Never Smoker  . Smokeless tobacco: Never Used  . Tobacco comment: tobacco use- no   Substance Use Topics  . Alcohol use: No      Objective:   BP 138/68 (BP Location: Left Arm, Patient Position:  Sitting, Cuff Size: Small)   Pulse 70   Temp 98.4 F (36.9 C)   Resp 16   Wt 91 lb (41.3 kg)   SpO2 97%   BMI 19.02 kg/m  Vitals:   04/04/18 1633  BP: 138/68  Pulse: 70  Resp: 16  Temp: 98.4 F (36.9 C)  SpO2: 97%  Weight: 91 lb (41.3 kg)     Physical Exam Constitutional:      Appearance: She is well-developed.     Comments: Cachectic WF NAD.  HENT:     Head: Normocephalic and atraumatic.     Right Ear: External ear normal.     Left Ear: External ear normal.     Nose: Nose normal.  Eyes:     General: No scleral icterus.    Conjunctiva/sclera: Conjunctivae normal.  Neck:     Thyroid: No thyromegaly.  Cardiovascular:     Rate and Rhythm: Normal rate and regular rhythm.     Heart sounds: Normal heart sounds.  Pulmonary:      Effort: Pulmonary effort is normal.     Breath sounds: Normal breath sounds.  Abdominal:     Palpations: Abdomen is soft.  Musculoskeletal:     Comments: 1+ LE edema.  Skin:    General: Skin is warm and dry.  Neurological:     Mental Status: She is alert and oriented to person, place, and time.  Psychiatric:        Behavior: Behavior normal.        Thought Content: Thought content normal.        Judgment: Judgment normal.         Assessment & Plan    1. Adjustment reaction of late life   2. Depression, major, single episode, moderate ([place]) At this time will try Paxil at a low dose of 10 mg nightly.  Recheck in 1 month.  Consider CCM consult. More than 50% of this 25-minute visit is spent in counseling and/or coordination of care. 3. Weight loss, unintentional Malnutrition associated with this weight loss.  4. Atherosclerosis of native coronary artery of native heart without angina pectoris   5. Atrial fibrillation, unspecified type ([place]) 6.Failure to thrive in adult   I have done the exam and reviewed the above chart and it is accurate to the best of my knowledge. Development worker, community has been used in this note in any air is in the dictation or transcription are unintentional.  Wilhemena Durie, MD  [place]

## 2018-04-15 ENCOUNTER — Ambulatory Visit (INDEPENDENT_AMBULATORY_CARE_PROVIDER_SITE_OTHER): Payer: Medicare Other

## 2018-04-15 DIAGNOSIS — Z9889 Other specified postprocedural states: Secondary | ICD-10-CM

## 2018-04-15 DIAGNOSIS — I4891 Unspecified atrial fibrillation: Secondary | ICD-10-CM

## 2018-04-15 DIAGNOSIS — Z7901 Long term (current) use of anticoagulants: Secondary | ICD-10-CM | POA: Diagnosis not present

## 2018-04-15 LAB — POCT INR: INR: 5.8 — AB (ref 2.0–3.0)

## 2018-04-15 NOTE — Patient Instructions (Signed)
Please skip coumadin tonight, tomorrow & Wednesday, take 1/2 tablet on Thursday, then continue of dosage 1 tablet daily except 1/2 TABLET ON MONDAYS, [place]. Recheck in 2 weeks.

## 2018-04-22 ENCOUNTER — Telehealth: Payer: Self-pay | Admitting: Family Medicine

## 2018-04-22 NOTE — Telephone Encounter (Signed)
Pt's Granddaughter calling regarding pt having diarrhea.  Needing to know if it's ok for her to take Pepto Bismol more or to start taking Imodium?  Please advise.  Thanks, [employer]

## 2018-04-23 NOTE — Telephone Encounter (Signed)
Patient's granddaughter Kathy Mccall was advised.

## 2018-04-23 NOTE — Telephone Encounter (Signed)
May use Imodium 2-3 times a day for 2-3 days if having diarrhea. If it persists, should let us know. She could easily get dehydrated and may need IV fluids at the ER.

## 2018-04-25 ENCOUNTER — Ambulatory Visit (INDEPENDENT_AMBULATORY_CARE_PROVIDER_SITE_OTHER): Payer: Medicare Other | Admitting: Family Medicine

## 2018-04-25 ENCOUNTER — Encounter: Payer: Self-pay | Admitting: Family Medicine

## 2018-04-25 VITALS — BP 130/66 | HR 89 | Temp 97.6°F | Resp 16 | Wt 86.8 lb

## 2018-04-25 DIAGNOSIS — R634 Abnormal weight loss: Secondary | ICD-10-CM

## 2018-04-25 DIAGNOSIS — R627 Adult failure to thrive: Secondary | ICD-10-CM | POA: Diagnosis not present

## 2018-04-25 DIAGNOSIS — R1084 Generalized abdominal pain: Secondary | ICD-10-CM

## 2018-04-25 DIAGNOSIS — I4891 Unspecified atrial fibrillation: Secondary | ICD-10-CM

## 2018-04-25 DIAGNOSIS — R1030 Lower abdominal pain, unspecified: Secondary | ICD-10-CM | POA: Diagnosis not present

## 2018-04-25 DIAGNOSIS — R197 Diarrhea, unspecified: Secondary | ICD-10-CM | POA: Diagnosis not present

## 2018-04-25 NOTE — Patient Instructions (Signed)
Stop Iron, begin taking over the counter daily probiotic. Call eye doctor or keep scheduled appt to see eye doctor.

## 2018-04-25 NOTE — Progress Notes (Signed)
Patient: Kathy Mccall Female    DOB: 20-Sep-1923   85 y.o.   MRN: [id number] Visit Date: 04/25/2018  Today's Provider: Wilhemena Durie, MD   Chief Complaint  Patient presents with  . Diarrhea  . Blurred Vision   Subjective:     Diarrhea   This is a recurrent problem. The current episode started 1 to 4 weeks ago. The problem has been unchanged. The stool consistency is described as watery. The patient states that diarrhea awakens her from sleep. Associated symptoms include abdominal pain and weight loss. Pertinent negatives include no arthralgias, bloating, chills, coughing, fever, headaches, increased  flatus, myalgias, sweats, URI or vomiting. Nothing aggravates the symptoms. She has tried increased fluids and bismuth subsalicylate for the symptoms. The treatment provided no relief.  Eye Problem   The left eye is affected. This is a new problem. The current episode started in the past 7 days. The problem has been unchanged. There was no injury mechanism. The patient is experiencing no pain. Associated symptoms include blurred vision. Pertinent negatives include no eye discharge, double vision, fever, itching, photophobia or vomiting. She has tried nothing for the symptoms.   Daughter is with the patient today which is helpful as she is nearly deaf.  Daughter is worried about weight loss.  Patient complains of crampy abdominal discomfort the diarrhea but no other real complaints.  Denies that she is  depressed.   Allergies  Allergen Reactions  . Sulfa Antibiotics   . Sulfonamide Derivatives      Current Outpatient Medications:  .  calcium-vitamin D (OSCAL 500/200 D-3) 500-200 MG-UNIT per tablet, Take 1 tablet by mouth daily.  , Disp: , Rfl:  .  DAILY MULTIPLE VITAMINS tablet, Take 1 tablet by mouth daily.  , Disp: , Rfl:  .  FERREX 150 150 MG capsule, TAKE 1 CAPSULE BY MOUTH ONCE DAILY, Disp: 30 capsule, Rfl: 12 .  furosemide (LASIX) 20 MG tablet, TAKE 1 TABLET BY MOUTH  ONCE DAILY, Disp: 90 tablet, Rfl: 3 .  HYDROcodone Bitartrate 20 MG C12A, Take 0.5 tablets by mouth daily., Disp: , Rfl:  .  latanoprost (XALATAN) 0.005 % ophthalmic solution, 1 drop at bedtime.  , Disp: , Rfl:  .  levothyroxine (SYNTHROID, LEVOTHROID) 50 MCG tablet, TAKE 1 TABLET BY MOUTH ONCE DAILY, Disp: 90 tablet, Rfl: 3 .  lisinopril (PRINIVIL,ZESTRIL) 10 MG tablet, TAKE 1 TABLET BY MOUTH ONCE DAILY, Disp: 90 tablet, Rfl: 3 .  mirtazapine (REMERON) 30 MG tablet, Take 1 tablet (30 mg total) by mouth at bedtime., Disp: 30 tablet, Rfl: 11 .  omeprazole (PRILOSEC) 20 MG capsule, TAKE 1 CAPSULE BY MOUTH ONCE DAILY, Disp: 90 capsule, Rfl: 3 .  PARoxetine (PAXIL) 10 MG tablet, Take 1 tablet (10 mg total) by mouth daily., Disp: 30 tablet, Rfl: 11 .  potassium chloride SA (K-DUR,KLOR-CON) 20 MEQ tablet, TAKE 1 TABLET BY MOUTH ONCE DAILY, Disp: 30 tablet, Rfl: 1 .  pravastatin (PRAVACHOL) 20 MG tablet, TAKE 1 TABLET BY MOUTH ONCE DAILY, Disp: 90 tablet, Rfl: 3 .  timolol (TIMOPTIC) 0.5 % ophthalmic solution, 1 drop.  , Disp: , Rfl:  .  warfarin (COUMADIN) 3 MG tablet, TAKE 1 TABLET BY MOUTH ONCE DAILY, Disp: 90 tablet, Rfl: 1  Review of Systems  Constitutional: Positive for weight loss. Negative for chills and fever.  HENT: Negative.   Eyes: Positive for blurred vision and visual disturbance. Negative for double vision, photophobia, discharge and itching.  Evidently has some difficulty seeing out of the left eye.  I am unable to ascertain any further on today's visit.  She has difficulty cooperating with instructions on the exam  Respiratory: Negative.  Negative for cough.   Cardiovascular: Negative.   Gastrointestinal: Positive for abdominal pain and diarrhea. Negative for bloating, flatus and vomiting.  Endocrine: Negative.   Genitourinary: Negative.   Musculoskeletal: Negative.  Negative for arthralgias and myalgias.  Allergic/Immunologic: Negative.   Neurological: Negative for headaches.   Psychiatric/Behavioral: Negative.     Social History   Tobacco Use  . Smoking status: Never Smoker  . Smokeless tobacco: Never Used  . Tobacco comment: tobacco use- no   Substance Use Topics  . Alcohol use: No      Objective:   BP 130/66   Pulse 89   Temp 97.6 F (36.4 C) (Oral)   Resp 16   Wt 86 lb 12.8 oz (39.4 kg)   BMI 18.14 kg/m  Vitals:   04/25/18 1126  BP: 130/66  Pulse: 89  Resp: 16  Temp: 97.6 F (36.4 C)  TempSrc: Oral  Weight: 86 lb 12.8 oz (39.4 kg)     Physical Exam Constitutional:      General: She is not in acute distress.    Appearance: Normal appearance. She is not toxic-appearing.     Comments: Cachectic appearing white female in no acute distress.  [place] membranes are mildly dry.  There is no tenting of her skin.  HENT:     Head: Normocephalic and atraumatic.     Right Ear: External ear normal.     Left Ear: External ear normal.     Nose: Nose normal.     Mouth/Throat:     Mouth: Mucous membranes are moist.     Pharynx: Oropharynx is clear.  Eyes:     General: No scleral icterus.    Conjunctiva/sclera: Conjunctivae normal.     Pupils: Pupils are equal, round, and reactive to light.     Comments: She can see out of both eyes.  I am unable to do a vision field exam due to her severe hearing disability.  Neck:     Musculoskeletal: No muscular tenderness.  Cardiovascular:     Rate and Rhythm: Normal rate and regular rhythm.  Pulmonary:     Effort: Pulmonary effort is normal.     Breath sounds: Normal breath sounds.  Abdominal:     General: Bowel sounds are normal.     Palpations: There is no mass.     Tenderness: There is no abdominal tenderness.  Musculoskeletal:        General: No swelling.  Lymphadenopathy:     Cervical: No cervical adenopathy.  Skin:    General: Skin is warm and dry.  Neurological:     General: No focal deficit present.     Mental Status: She is alert. Mental status is at baseline.  Psychiatric:         Mood and Affect: Mood normal.        Behavior: Behavior normal.        Thought Content: Thought content normal.        Judgment: Judgment normal.         Assessment & Plan    1. Weight loss I am currently concerned about ongoing weight loss in this 85 year old but I think the patient is fairly comfortable with her decline.  I think the daughter is more uncomfortable that she is steady physical decline.  May have to get a palliative care consult in the future. - CBC with Differential/Platelet - Comprehensive metabolic panel - TSH - Lipase  2. Diarrhea, unspecified type At this time will stop iron and try an over-the-counter probiotic.  [place] obtain a general abdominal ultrasound.  Refer to GI due to the weight loss.  3. Lower abdominal pain This is crampy pain.  Abdominal exam today is benign.  There is no tenderness at all.  Consider stool cultures if the diarrhea persists - CBC with Differential/Platelet - Comprehensive metabolic panel - Lipase - Sed Rate (ESR) - US Abdomen Complete; Future - Ambulatory referral to Gastroenterology  4. Atrial fibrillation, unspecified type ([place])  - INR/PT  5. Failure to thrive in adult Follow-up in 1 to 2 weeks.      Cranford Mon, MD  [facility]

## 2018-04-26 LAB — CBC WITH DIFFERENTIAL/PLATELET
BASOS: 0 %
Basophils Absolute: 0 10*3/uL (ref 0.0–0.2)
EOS (ABSOLUTE): 0 10*3/uL (ref 0.0–0.4)
EOS: 0 %
HEMATOCRIT: 38.9 % (ref 34.0–46.6)
HEMOGLOBIN: 13.5 g/dL (ref 11.1–15.9)
Immature Grans (Abs): 0 10*3/uL (ref 0.0–0.1)
Immature Granulocytes: 0 %
LYMPHS ABS: 0.8 10*3/uL (ref 0.7–3.1)
Lymphs: 11 %
MCH: 35.1 pg — AB (ref 26.6–33.0)
MCHC: 34.7 g/dL (ref 31.5–35.7)
MCV: 101 fL — AB (ref 79–97)
MONOCYTES: 8 %
MONOS ABS: 0.6 10*3/uL (ref 0.1–0.9)
NEUTROS ABS: 5.8 10*3/uL (ref 1.4–7.0)
Neutrophils: 81 %
Platelets: 233 10*3/uL (ref 150–450)
RBC: 3.85 x10E6/uL (ref 3.77–5.28)
RDW: 12.4 % (ref 12.3–15.4)
WBC: 7.3 10*3/uL (ref 3.4–10.8)

## 2018-04-26 LAB — COMPREHENSIVE METABOLIC PANEL
A/G RATIO: 1 — AB (ref 1.2–2.2)
ALBUMIN: 3.7 g/dL (ref 3.2–4.6)
ALT: 19 IU/L (ref 0–32)
AST: 36 IU/L (ref 0–40)
Alkaline Phosphatase: 136 IU/L — ABNORMAL HIGH (ref 39–117)
BUN / CREAT RATIO: 21 (ref 12–28)
BUN: 14 mg/dL (ref 10–36)
Bilirubin Total: 1.4 mg/dL — ABNORMAL HIGH (ref 0.0–1.2)
CO2: 22 mmol/L (ref 20–29)
Calcium: 9.6 mg/dL (ref 8.7–10.3)
Chloride: 94 mmol/L — ABNORMAL LOW (ref 96–106)
Creatinine, Ser: 0.66 mg/dL (ref 0.57–1.00)
GFR calc Af Amer: 89 mL/min/{1.73_m2} (ref >59)
GFR calc non Af Amer: 77 mL/min/{1.73_m2} (ref >59)
Globulin, Total: 3.6 g/dL (ref 1.5–4.5)
Glucose: 118 mg/dL — ABNORMAL HIGH (ref 65–99)
Potassium: 4.2 mmol/L (ref 3.5–5.2)
Sodium: 134 mmol/L (ref 134–144)
Total Protein: 7.3 g/dL (ref 6.0–8.5)

## 2018-04-26 LAB — PROTIME-INR
INR: 3 — ABNORMAL HIGH (ref 0.8–1.2)
Prothrombin Time: 28.9 s — ABNORMAL HIGH (ref 9.1–12.0)

## 2018-04-26 LAB — SEDIMENTATION RATE: Sed Rate: 24 mm/hr (ref 0–40)

## 2018-04-26 LAB — TSH: TSH: 3.86 u[IU]/mL (ref 0.450–4.500)

## 2018-04-26 LAB — LIPASE: Lipase: 17 U/L (ref 14–85)

## 2018-04-28 NOTE — Progress Notes (Signed)
Cardiology Office Note  Date:  04/29/2018   ID:  Kathy Mccall, Kathy Mccall [birth year], MRN [id number]  PCP:  Jerrol Banana., MD   Chief Complaint  Patient presents with  . OTHER    OD 12 month f/u c/o abd pain and moaning when she breathes. Meds reviewed verbally with pt.    HPI:  Kathy Mccall is a very pleasant 85 year old woman with a history of  rheumatic valve disease, [employer] Jude aortic valve replacement in 1991,  chronic atrial fibrillation on warfarin,   severe tricuspid regurgitation on echocardiogram dating back to 2008,   mild to moderate pulmonary hypertension (RVSP estimated at 46),  She has been stable on Lasix daily. who presents for routine followup of her atrial fibrillation and prosthetic valve  .   In follow-up today she reports that she is not eating well Has lost significant weight over the past month Reports having chronic diarrhea over the past month Continues to have episodic abdominal pain "Watery diarrhea all of christmas week" Previously taking Imodium Reports that she has been seen by GI X-ray performed today, ultrasound scheduled in 2 days time Might need CT scan Never had colonoscopy  Lab work reviewed with her Total bili 1.4 Alk phos 136  She presents in wheelchair today, minimally conversant but responding to questions when asked Hard of hearing  Notes from other providers indicating recent loss of her sister, depression she lost her daughter to cancer ,  Colon cancer with metastases to the lung.  Denies any shortness of breath or chest pain Walking with a cane, no falls Legs are getting weaker  Lab work reviewed Total chol 134, LDL 60  EKG personally reviewed by myself on todays visit Shows atrial fibrillation rate in the 90s no significant ST or T wave changes  Other past medical history reviewed Echocardiogram from August 2017 reviewed with her showing moderate aortic valve stenosis, severe tricuspid valve regurgitation, moderate  MR   echo was in early 2014 . Normal ejection fraction, severe TR, well-seated aortic valve .  No falls. No complications on warfarin  Cardiac catheterization in August 2008 showed mild to moderate distal left main disease, moderate ostial left circumflex disease, mild ostial LAD and ostial B1 disease, severe disease of the PL range medical management was recommended.   In the past, she has had difficulty tolerating Lipitor, Now on low-dose pravastatin Total cholesterol 166, LDL 85, HDL 65  PMH:   has a past medical history of Arthritis, Atrial fibrillation ([place]), Congestive heart failure ([place]), Hyperlipidemia, Hypertension, Rheumatic fever, and S/P aortic valve replacement.  PSH:    Past Surgical History:  Procedure Laterality Date  . AORTIC VALVE REPLACEMENT    . UPPER GI ENDOSCOPY     01/09/08 normal esophagus, normal duodenum, non-bleeding erosive gastropathy    Current Outpatient Medications  Medication Sig Dispense Refill  . calcium-vitamin D (OSCAL 500/200 D-3) 500-200 MG-UNIT per tablet Take 1 tablet by mouth daily.      [name] DAILY MULTIPLE VITAMINS tablet Take 1 tablet by mouth daily.      . furosemide (LASIX) 20 MG tablet TAKE 1 TABLET BY MOUTH ONCE DAILY 90 tablet 3  . latanoprost (XALATAN) 0.005 % ophthalmic solution 1 drop at bedtime.      [name] levothyroxine (SYNTHROID, LEVOTHROID) 50 MCG tablet TAKE 1 TABLET BY MOUTH ONCE DAILY 90 tablet 3  . lisinopril (PRINIVIL,ZESTRIL) 10 MG tablet TAKE 1 TABLET BY MOUTH ONCE DAILY 90 tablet 3  . omeprazole (PRILOSEC)  20 MG capsule TAKE 1 CAPSULE BY MOUTH ONCE DAILY 90 capsule 3  . PARoxetine (PAXIL) 10 MG tablet Take 1 tablet (10 mg total) by mouth daily. 30 tablet 11  . potassium chloride SA (K-DUR,KLOR-CON) 20 MEQ tablet TAKE 1 TABLET BY MOUTH ONCE DAILY 30 tablet 1  . pravastatin (PRAVACHOL) 20 MG tablet TAKE 1 TABLET BY MOUTH ONCE DAILY 90 tablet 3  . Probiotic Product (ALIGN PO) Take by mouth daily.    . timolol (TIMOPTIC) 0.5 %  ophthalmic solution 1 drop.      [name] warfarin (COUMADIN) 3 MG tablet TAKE 1 TABLET BY MOUTH ONCE DAILY 90 tablet 1   No current facility-administered medications for this visit.      Allergies:   Sulfa antibiotics and Sulfonamide derivatives   Social History:  The patient  reports that she has never smoked. She has never used smokeless tobacco. She reports that she does not drink alcohol or use drugs.   Family History:   family history includes Arthritis in her brother and father; Cancer in her brother and daughter; Cerebral palsy in her daughter; Diabetes in her sister; Heart attack in her brother, father, and sister; Heart disease in her mother; Hypertension in her mother.    Review of Systems: Review of Systems  Constitutional: Positive for malaise/fatigue.  Respiratory: Negative.   Cardiovascular: Negative.   Gastrointestinal: Positive for abdominal pain and diarrhea.  Musculoskeletal: Negative.   Neurological: Negative.   Psychiatric/Behavioral: Negative.   All other systems reviewed and are negative.    PHYSICAL EXAM: VS:  BP 110/60 (BP Location: Left Arm, Patient Position: Sitting, Cuff Size: Normal)   Pulse 83   Ht '5\' 3"'$  (1.6 m)   Wt 89 lb 12 oz (40.7 kg)   BMI 15.90 kg/m  , BMI Body mass index is 15.9 kg/m.  Constitutional:  oriented to person, place, and time. No distress.  Thin, muscle atrophy HENT:  Head: Grossly normal Eyes:  no discharge. No scleral icterus.  Neck: No JVD, no carotid bruits  Cardiovascular: Irregularly irregular,  no murmurs appreciated Pulmonary/Chest: Clear to auscultation bilaterally, no wheezes or rails Abdominal: Soft.  no distension.  no tenderness.  Musculoskeletal: Normal range of motion Neurological:  normal muscle tone. Coordination normal. No atrophy Skin: Skin warm and dry Psychiatric: Appears depressed   Recent Labs: 04/25/2018: ALT 19; BUN 14; Creatinine, Ser 0.66; Hemoglobin 13.5; Platelets 233; Potassium 4.2; Sodium 134; TSH  3.860    Lipid Panel Lab Results  Component Value Date   CHOL 124 10/08/2017   HDL 55 10/08/2017   LDLCALC 57 10/08/2017   TRIG 59 10/08/2017      Wt Readings from Last 3 Encounters:  04/29/18 89 lb 12 oz (40.7 kg)  04/29/18 90 lb (40.8 kg)  04/25/18 86 lb 12.8 oz (39.4 kg)       ASSESSMENT AND PLAN:  S/P AVR (aortic valve replacement) - Moderate aortic valve stenosis, prosthetic valve is more than 83 years old asymptomatic We will wait on echocardiogram until her abdominal symptoms have improved In her current state would not be a good candidate for surgery  Atrial fibrillation with RVR ([place]) - Plan: ECHOCARDIOGRAM COMPLETE INR elevated likely from dietary changes Medication adjusted  SOB (shortness of breath) -  No significant shortness of breath, no leg swelling Continue current medications  Tricuspid valve regurgitation Severe tricuspid valve regurgitation , chronic issue Not a candidate for surgery and she has previously declined  Aortic valve stenosis Moderate stenosis of  prosthetic valve Asymptomatic We will discuss repeat echocardiogram in follow-up   Total encounter time more than 25 minutes  Greater than 50% was spent in counseling and coordination of care with the patient  Disposition:   F/U  12 months   Orders Placed This Encounter  Procedures  . EKG 12-Lead     Signed, Esmond Plants, M.D., Ph.D. 04/29/2018  [place], [place]

## 2018-04-29 ENCOUNTER — Ambulatory Visit (INDEPENDENT_AMBULATORY_CARE_PROVIDER_SITE_OTHER): Payer: Medicare Other

## 2018-04-29 ENCOUNTER — Other Ambulatory Visit: Payer: Self-pay

## 2018-04-29 ENCOUNTER — Ambulatory Visit
Admission: RE | Admit: 2018-04-29 | Discharge: 2018-04-29 | Disposition: A | Discharge status: Home / Self Care | Payer: Medicare Other | Source: Ambulatory Visit | Attending: Gastroenterology | Admitting: Gastroenterology

## 2018-04-29 ENCOUNTER — Ambulatory Visit: Payer: Medicare Other | Admitting: Gastroenterology

## 2018-04-29 ENCOUNTER — Telehealth: Payer: Self-pay

## 2018-04-29 ENCOUNTER — Encounter: Payer: Self-pay | Admitting: Gastroenterology

## 2018-04-29 ENCOUNTER — Ambulatory Visit: Payer: Medicare Other | Admitting: Cardiovascular Disease

## 2018-04-29 ENCOUNTER — Encounter: Payer: Self-pay | Admitting: Cardiovascular Disease

## 2018-04-29 VITALS — BP 110/60 | HR 83 | Ht 63.0 in | Wt 89.8 lb

## 2018-04-29 VITALS — BP 110/67 | HR 86 | Ht <= 58 in | Wt 90.0 lb

## 2018-04-29 DIAGNOSIS — R634 Abnormal weight loss: Secondary | ICD-10-CM | POA: Diagnosis not present

## 2018-04-29 DIAGNOSIS — Z7901 Long term (current) use of anticoagulants: Secondary | ICD-10-CM | POA: Diagnosis not present

## 2018-04-29 DIAGNOSIS — Z9889 Other specified postprocedural states: Secondary | ICD-10-CM

## 2018-04-29 DIAGNOSIS — I10 Essential (primary) hypertension: Secondary | ICD-10-CM

## 2018-04-29 DIAGNOSIS — I350 Nonrheumatic aortic (valve) stenosis: Secondary | ICD-10-CM | POA: Diagnosis not present

## 2018-04-29 DIAGNOSIS — Z952 Presence of prosthetic heart valve: Secondary | ICD-10-CM | POA: Diagnosis not present

## 2018-04-29 DIAGNOSIS — R1030 Lower abdominal pain, unspecified: Secondary | ICD-10-CM

## 2018-04-29 DIAGNOSIS — I25118 Atherosclerotic heart disease of native coronary artery with other forms of angina pectoris: Secondary | ICD-10-CM | POA: Diagnosis not present

## 2018-04-29 DIAGNOSIS — Z882 Allergy status to sulfonamides status: Secondary | ICD-10-CM | POA: Diagnosis not present

## 2018-04-29 DIAGNOSIS — K56609 Unspecified intestinal obstruction, unspecified as to partial versus complete obstruction: Secondary | ICD-10-CM | POA: Diagnosis not present

## 2018-04-29 DIAGNOSIS — K56699 Other intestinal obstruction unspecified as to partial versus complete obstruction: Secondary | ICD-10-CM | POA: Diagnosis not present

## 2018-04-29 DIAGNOSIS — R54 Age-related physical debility: Secondary | ICD-10-CM | POA: Diagnosis not present

## 2018-04-29 DIAGNOSIS — I4891 Unspecified atrial fibrillation: Secondary | ICD-10-CM

## 2018-04-29 DIAGNOSIS — K56600 Partial intestinal obstruction, unspecified as to cause: Secondary | ICD-10-CM | POA: Diagnosis not present

## 2018-04-29 DIAGNOSIS — I509 Heart failure, unspecified: Secondary | ICD-10-CM | POA: Diagnosis not present

## 2018-04-29 DIAGNOSIS — I2510 Atherosclerotic heart disease of native coronary artery without angina pectoris: Secondary | ICD-10-CM | POA: Diagnosis not present

## 2018-04-29 DIAGNOSIS — E860 Dehydration: Secondary | ICD-10-CM | POA: Diagnosis not present

## 2018-04-29 DIAGNOSIS — K862 Cyst of pancreas: Secondary | ICD-10-CM | POA: Diagnosis not present

## 2018-04-29 DIAGNOSIS — Z515 Encounter for palliative care: Secondary | ICD-10-CM | POA: Diagnosis not present

## 2018-04-29 DIAGNOSIS — C185 Malignant neoplasm of splenic flexure: Secondary | ICD-10-CM | POA: Diagnosis not present

## 2018-04-29 DIAGNOSIS — H9190 Unspecified hearing loss, unspecified ear: Secondary | ICD-10-CM | POA: Diagnosis not present

## 2018-04-29 DIAGNOSIS — E785 Hyperlipidemia, unspecified: Secondary | ICD-10-CM | POA: Diagnosis not present

## 2018-04-29 DIAGNOSIS — Z79899 Other long term (current) drug therapy: Secondary | ICD-10-CM | POA: Diagnosis not present

## 2018-04-29 DIAGNOSIS — Z7401 Bed confinement status: Secondary | ICD-10-CM | POA: Diagnosis not present

## 2018-04-29 DIAGNOSIS — M1990 Unspecified osteoarthritis, unspecified site: Secondary | ICD-10-CM | POA: Diagnosis not present

## 2018-04-29 DIAGNOSIS — Z7189 Other specified counseling: Secondary | ICD-10-CM

## 2018-04-29 DIAGNOSIS — K863 Pseudocyst of pancreas: Secondary | ICD-10-CM | POA: Diagnosis not present

## 2018-04-29 DIAGNOSIS — E871 Hypo-osmolality and hyponatremia: Secondary | ICD-10-CM | POA: Diagnosis not present

## 2018-04-29 DIAGNOSIS — I110 Hypertensive heart disease with heart failure: Secondary | ICD-10-CM | POA: Diagnosis not present

## 2018-04-29 DIAGNOSIS — K5900 Constipation, unspecified: Secondary | ICD-10-CM | POA: Diagnosis not present

## 2018-04-29 DIAGNOSIS — Z66 Do not resuscitate: Secondary | ICD-10-CM | POA: Diagnosis not present

## 2018-04-29 DIAGNOSIS — R1909 Other intra-abdominal and pelvic swelling, mass and lump: Secondary | ICD-10-CM | POA: Diagnosis not present

## 2018-04-29 DIAGNOSIS — E782 Mixed hyperlipidemia: Secondary | ICD-10-CM

## 2018-04-29 DIAGNOSIS — R072 Precordial pain: Secondary | ICD-10-CM

## 2018-04-29 DIAGNOSIS — R109 Unspecified abdominal pain: Secondary | ICD-10-CM | POA: Diagnosis not present

## 2018-04-29 DIAGNOSIS — E039 Hypothyroidism, unspecified: Secondary | ICD-10-CM | POA: Diagnosis not present

## 2018-04-29 DIAGNOSIS — C787 Secondary malignant neoplasm of liver and intrahepatic bile duct: Secondary | ICD-10-CM | POA: Diagnosis not present

## 2018-04-29 DIAGNOSIS — K8020 Calculus of gallbladder without cholecystitis without obstruction: Secondary | ICD-10-CM | POA: Diagnosis not present

## 2018-04-29 DIAGNOSIS — E43 Unspecified severe protein-calorie malnutrition: Secondary | ICD-10-CM | POA: Diagnosis not present

## 2018-04-29 LAB — POCT INR: INR: 5 — AB (ref 2.0–3.0)

## 2018-04-29 NOTE — Telephone Encounter (Signed)
Left message to call back for Jody (granddaughter).

## 2018-04-29 NOTE — Patient Instructions (Signed)

## 2018-04-29 NOTE — Telephone Encounter (Signed)
Patient advised as below.  

## 2018-04-29 NOTE — Progress Notes (Signed)
Jonathon Bellows MD, MRCP(U.K) [address]  [place]  [place], [place] [address]  Main: [phone]  Fax: [phone]   Gastroenterology Consultation  Referring Provider:     Jerrol Banana.,* Primary Care Physician:  Jerrol Banana., MD Primary Gastroenterologist:  Dr. Jonathon Bellows  Reason for Consultation:     Abdominal pain        HPI:   Kathy Mccall is a 85 y.o. y/o female referred for consultation & management  by Dr. Rosanna Randy, Retia Passe., MD.    She was recently seen by Dr Rosanna Randy and has been referred for weight loss, abdominal pain and diarrhea.   Labs 04/2018: Hb 13.5 grams , alk phos 136, T bilirubin 1.4 . She has been scheduled for an ultrasound of her abdomen on 05/01/2018.   The diarrhea stopped last week.  She has lost 15 lbs unintentionally last 1 year. She has been depressed since her daughter passed a year back.   She has abdominal pain for a week . Lower abdominal , all day long. No aggrevating factors, worse at times when she eats. She has not had a bowel movement till last night for a week , large . Felt better.Denies any NSAID use. On prilosec 20 mg. Once a day   Not eating much last few days as she feels like not eating . Denies any pain while eating.   Past Medical History:  Diagnosis Date  . Arthritis   . Atrial fibrillation ([place])   . Congestive heart failure ([place])   . Hyperlipidemia   . Hypertension   . Rheumatic fever   . S/P aortic valve replacement    st. jude    Past Surgical History:  Procedure Laterality Date  . AORTIC VALVE REPLACEMENT    . UPPER GI ENDOSCOPY     01/09/08 normal esophagus, normal duodenum, non-bleeding erosive gastropathy    Prior to Admission medications   Medication Sig Start Date End Date Taking? Authorizing Provider  calcium-vitamin D (OSCAL 500/200 D-3) 500-200 MG-UNIT per tablet Take 1 tablet by mouth daily.      [provider]  DAILY MULTIPLE VITAMINS tablet Take 1 tablet by mouth  daily.      [provider]  FERREX 150 150 MG capsule TAKE 1 CAPSULE BY MOUTH ONCE DAILY 07/05/17   Jerrol Banana., MD  furosemide (LASIX) 20 MG tablet TAKE 1 TABLET BY MOUTH ONCE DAILY 11/20/17   Minna Merritts, MD  HYDROcodone Bitartrate 20 MG C12A Take 0.5 tablets by mouth daily.    [provider]  latanoprost (XALATAN) 0.005 % ophthalmic solution 1 drop at bedtime.      [provider]  levothyroxine (SYNTHROID, LEVOTHROID) 50 MCG tablet TAKE 1 TABLET BY MOUTH ONCE DAILY 03/25/18   Jerrol Banana., MD  lisinopril (PRINIVIL,ZESTRIL) 10 MG tablet TAKE 1 TABLET BY MOUTH ONCE DAILY 02/08/18   Jerrol Banana., MD  mirtazapine (REMERON) 30 MG tablet Take 1 tablet (30 mg total) by mouth at bedtime. 02/21/18   Jerrol Banana., MD  omeprazole (PRILOSEC) 20 MG capsule TAKE 1 CAPSULE BY MOUTH ONCE DAILY 02/27/18   Jerrol Banana., MD  PARoxetine (PAXIL) 10 MG tablet Take 1 tablet (10 mg total) by mouth daily. 04/04/18   Jerrol Banana., MD  potassium chloride SA (K-DUR,KLOR-CON) 20 MEQ tablet TAKE 1 TABLET BY MOUTH ONCE DAILY 03/25/18   Minna Merritts, MD  pravastatin (PRAVACHOL) 20 MG tablet TAKE 1 TABLET BY MOUTH ONCE DAILY 09/27/17   Jerrol Banana., MD  timolol (TIMOPTIC) 0.5 % ophthalmic solution 1 drop.      [provider]  warfarin (COUMADIN) 3 MG tablet TAKE 1 TABLET BY MOUTH ONCE DAILY 02/27/18   Jerrol Banana., MD    Family History  Problem Relation Age of Onset  . Heart disease Mother   . Hypertension Mother   . Arthritis Father   . Heart attack Father   . Heart attack Sister   . Diabetes Sister   . Cancer Brother   . Heart attack Brother   . Arthritis Brother   . Cancer Daughter        Colon cancer  . Cerebral palsy Daughter      Social History   Tobacco Use  . Smoking status: Never Smoker  . Smokeless tobacco: Never Used  . Tobacco comment: tobacco use- no   Substance Use Topics    . Alcohol use: No  . Drug use: No    Allergies as of 04/29/2018 - Review Complete 04/29/2018  Allergen Reaction Noted  . Sulfa antibiotics  01/04/2015  . Sulfonamide derivatives  01/14/2010    Review of Systems:    All systems reviewed and negative except where noted in HPI.   Physical Exam:  BP 110/67   Pulse 86   Ht '4\' 10"'$  (1.473 m)   Wt 90 lb (40.8 kg)   BMI 18.81 kg/m  No LMP recorded. Patient is postmenopausal. Psych:  Alert and cooperative. Flat affect General:   Alert,  Thin and sitting in a wheel chair  Head:  Normocephalic and atraumatic. Eyes:  Sclera clear, no icterus.   Conjunctiva pink. Ears:  Normal auditory acuity. Nose:  No deformity, discharge, or lesions. Mouth:  No deformity or lesions,oropharynx pink & moist. Neck:  Supple; no masses or thyromegaly. Lungs:  Respirations even and unlabored.  Clear throughout to auscultation.   No wheezes, crackles, or rhonchi. No acute distress. Heart:  Regular rate and rhythm; no murmurs, clicks, rubs, or gallops. Abdomen:  Normal bowel sounds.  No bruits.  Mildly distended , minimal lower abdominal tenderness , no  masses, hepatosplenomegaly or hernias noted.  No guarding or rebound tenderness.    Neurologic:  Alert and oriented x3;  grossly normal neurologically. Skin:  Intact without significant lesions or rashes. No jaundice. Lymph Nodes:  No significant cervical adenopathy. Psych:  Alert and cooperative. Normal mood and affect.  Imaging Studies: No results found.  Assessment and Plan:   Kathy Mccall is a 85 y.o. y/o female has been referred for abdominal pain , weight loss and diarrhea. The diarrhea has stopped. Abdominal pain ongoing for a week . Unintentional weight loss. Here with daughter. Patient has a very flat affect . Explained abdominal pain may be a post infectious IBS which gets better in a few weeks or could be from constipation. I will get an X ray abdomen to help rule out. In terms of weight loss,  she is due for an ultrasound of her abdomen and if negative may need a CT scan. She has never had a colonoscopy and her daughter did have colon cancer. She appears very frail and weak and I do not think it a good decision to perform a colonoscopy unless absolutely required. Risks exceed the benefits and the daughter agrees. Will also commence on IB guard and increase dose of omeprazole to 20 mg BID  Follow up in 7-10 days   Dr Jonathon Bellows MD,MRCP(U.K)

## 2018-04-29 NOTE — Telephone Encounter (Signed)
-----   Message from Jerrol Banana., MD sent at 04/29/2018  3:01 PM EST ----- Stable--

## 2018-04-29 NOTE — Patient Instructions (Signed)
Please skip coumadin tonight, tomorrow ,then [place] 1/2 TABLET EVERY DAY.  Recheck in 2 weeks.

## 2018-04-30 ENCOUNTER — Emergency Department: Payer: Medicare Other

## 2018-04-30 ENCOUNTER — Encounter: Payer: Self-pay | Admitting: Emergency Medicine

## 2018-04-30 ENCOUNTER — Other Ambulatory Visit: Payer: Self-pay

## 2018-04-30 ENCOUNTER — Telehealth: Payer: Self-pay | Admitting: Family Medicine

## 2018-04-30 ENCOUNTER — Inpatient Hospital Stay
Admission: EM | Admit: 2018-04-30 | Discharge: 2018-05-03 | DRG: 374 | Disposition: A | Discharge status: Hospice | Payer: Medicare Other | Attending: Internal Medicine | Admitting: Internal Medicine

## 2018-04-30 DIAGNOSIS — Z882 Allergy status to sulfonamides status: Secondary | ICD-10-CM

## 2018-04-30 DIAGNOSIS — Z7984 Long term (current) use of oral hypoglycemic drugs: Secondary | ICD-10-CM

## 2018-04-30 DIAGNOSIS — E871 Hypo-osmolality and hyponatremia: Secondary | ICD-10-CM | POA: Diagnosis present

## 2018-04-30 DIAGNOSIS — K863 Pseudocyst of pancreas: Secondary | ICD-10-CM | POA: Diagnosis present

## 2018-04-30 DIAGNOSIS — Z66 Do not resuscitate: Secondary | ICD-10-CM | POA: Diagnosis not present

## 2018-04-30 DIAGNOSIS — Z79899 Other long term (current) drug therapy: Secondary | ICD-10-CM

## 2018-04-30 DIAGNOSIS — M1990 Unspecified osteoarthritis, unspecified site: Secondary | ICD-10-CM | POA: Diagnosis present

## 2018-04-30 DIAGNOSIS — E785 Hyperlipidemia, unspecified: Secondary | ICD-10-CM | POA: Diagnosis present

## 2018-04-30 DIAGNOSIS — R634 Abnormal weight loss: Secondary | ICD-10-CM

## 2018-04-30 DIAGNOSIS — Z79890 Hormone replacement therapy (postmenopausal): Secondary | ICD-10-CM

## 2018-04-30 DIAGNOSIS — Z515 Encounter for palliative care: Secondary | ICD-10-CM

## 2018-04-30 DIAGNOSIS — I2510 Atherosclerotic heart disease of native coronary artery without angina pectoris: Secondary | ICD-10-CM | POA: Diagnosis present

## 2018-04-30 DIAGNOSIS — C787 Secondary malignant neoplasm of liver and intrahepatic bile duct: Secondary | ICD-10-CM | POA: Diagnosis present

## 2018-04-30 DIAGNOSIS — R197 Diarrhea, unspecified: Secondary | ICD-10-CM

## 2018-04-30 DIAGNOSIS — H9190 Unspecified hearing loss, unspecified ear: Secondary | ICD-10-CM | POA: Diagnosis present

## 2018-04-30 DIAGNOSIS — E43 Unspecified severe protein-calorie malnutrition: Secondary | ICD-10-CM

## 2018-04-30 DIAGNOSIS — Z7401 Bed confinement status: Secondary | ICD-10-CM

## 2018-04-30 DIAGNOSIS — I10 Essential (primary) hypertension: Secondary | ICD-10-CM | POA: Diagnosis present

## 2018-04-30 DIAGNOSIS — R1084 Generalized abdominal pain: Secondary | ICD-10-CM

## 2018-04-30 DIAGNOSIS — E860 Dehydration: Secondary | ICD-10-CM | POA: Diagnosis present

## 2018-04-30 DIAGNOSIS — Z7901 Long term (current) use of anticoagulants: Secondary | ICD-10-CM

## 2018-04-30 DIAGNOSIS — Z681 Body mass index (BMI) 19 or less, adult: Secondary | ICD-10-CM

## 2018-04-30 DIAGNOSIS — K56609 Unspecified intestinal obstruction, unspecified as to partial versus complete obstruction: Secondary | ICD-10-CM | POA: Diagnosis present

## 2018-04-30 DIAGNOSIS — R1030 Lower abdominal pain, unspecified: Secondary | ICD-10-CM

## 2018-04-30 DIAGNOSIS — I509 Heart failure, unspecified: Secondary | ICD-10-CM | POA: Diagnosis present

## 2018-04-30 DIAGNOSIS — C185 Malignant neoplasm of splenic flexure: Principal | ICD-10-CM | POA: Diagnosis present

## 2018-04-30 DIAGNOSIS — K862 Cyst of pancreas: Secondary | ICD-10-CM | POA: Diagnosis present

## 2018-04-30 DIAGNOSIS — I110 Hypertensive heart disease with heart failure: Secondary | ICD-10-CM | POA: Diagnosis present

## 2018-04-30 DIAGNOSIS — I4891 Unspecified atrial fibrillation: Secondary | ICD-10-CM | POA: Diagnosis present

## 2018-04-30 DIAGNOSIS — E039 Hypothyroidism, unspecified: Secondary | ICD-10-CM | POA: Diagnosis present

## 2018-04-30 DIAGNOSIS — K6389 Other specified diseases of intestine: Secondary | ICD-10-CM

## 2018-04-30 DIAGNOSIS — Z952 Presence of prosthetic heart valve: Secondary | ICD-10-CM

## 2018-04-30 LAB — LIPASE, BLOOD: Lipase: 22 U/L (ref 11–51)

## 2018-04-30 LAB — URINALYSIS, COMPLETE (UACMP) WITH MICROSCOPIC
Bilirubin Urine: NEGATIVE
GLUCOSE, UA: NEGATIVE mg/dL
Hgb urine dipstick: NEGATIVE
Ketones, ur: 5 mg/dL — AB
Leukocytes, UA: NEGATIVE
Nitrite: NEGATIVE
Protein, ur: NEGATIVE mg/dL
Specific Gravity, Urine: 1.034 — ABNORMAL HIGH (ref 1.005–1.030)
pH: 5 (ref 5.0–8.0)

## 2018-04-30 LAB — COMPREHENSIVE METABOLIC PANEL
ALT: 22 U/L (ref 0–44)
AST: 51 U/L — ABNORMAL HIGH (ref 15–41)
Albumin: 3.1 g/dL — ABNORMAL LOW (ref 3.5–5.0)
Alkaline Phosphatase: 106 U/L (ref 38–126)
Anion gap: 7 (ref 5–15)
BUN: 24 mg/dL — ABNORMAL HIGH (ref 8–23)
CO2: 25 mmol/L (ref 22–32)
CREATININE: 0.57 mg/dL (ref 0.44–1.00)
Calcium: 8.6 mg/dL — ABNORMAL LOW (ref 8.9–10.3)
Chloride: 97 mmol/L — ABNORMAL LOW (ref 98–111)
GFR calc non Af Amer: >60 mL/min (ref >60)
Glucose, Bld: 135 mg/dL — ABNORMAL HIGH (ref 70–99)
Potassium: 4.8 mmol/L (ref 3.5–5.1)
SODIUM: 129 mmol/L — AB (ref 135–145)
Total Bilirubin: 1.9 mg/dL — ABNORMAL HIGH (ref 0.3–1.2)
Total Protein: 6.5 g/dL (ref 6.5–8.1)

## 2018-04-30 LAB — CBC
HCT: 37.1 % (ref 36.0–46.0)
Hemoglobin: 12 g/dL (ref 12.0–15.0)
MCH: 34.4 pg — ABNORMAL HIGH (ref 26.0–34.0)
MCHC: 32.3 g/dL (ref 30.0–36.0)
MCV: 106.3 fL — ABNORMAL HIGH (ref 80.0–100.0)
NRBC: 0 % (ref 0.0–0.2)
PLATELETS: 224 10*3/uL (ref 150–400)
RBC: 3.49 MIL/uL — AB (ref 3.87–5.11)
RDW: 14.1 % (ref 11.5–15.5)
WBC: 8.3 10*3/uL (ref 4.0–10.5)

## 2018-04-30 MED ORDER — MORPHINE SULFATE (PF) 2 MG/ML IV SOLN
2.0000 mg | Freq: Once | INTRAVENOUS | Status: AC
  Administered 2018-04-30: 2 mg via INTRAVENOUS
  Filled 2018-04-30: qty 1

## 2018-04-30 MED ORDER — IOPAMIDOL (ISOVUE-300) INJECTION 61%
15.0000 mL | INTRAVENOUS | Status: AC
  Administered 2018-04-30: 15 mL via ORAL

## 2018-04-30 MED ORDER — SODIUM CHLORIDE 0.9 % IV BOLUS
500.0000 mL | Freq: Once | INTRAVENOUS | Status: AC
  Administered 2018-04-30: 500 mL via INTRAVENOUS

## 2018-04-30 MED ORDER — IOHEXOL 300 MG/ML  SOLN
75.0000 mL | Freq: Once | INTRAMUSCULAR | Status: AC | PRN
  Administered 2018-04-30: 75 mL via INTRAVENOUS

## 2018-04-30 NOTE — Telephone Encounter (Signed)
[facility] Dept. [phone] ask for Kim  Abdominal ultra sound scheduled for tomorrow at 9:30  AR has questions on order from Dr. Rosanna Randy.  Please advise.  Thanks, [employer]

## 2018-04-30 NOTE — Telephone Encounter (Signed)
Order has been clarified

## 2018-04-30 NOTE — ED Notes (Signed)
Ultrasound called and stated that pt is scheduled for outpatient [place] tomorrow.

## 2018-04-30 NOTE — ED Notes (Signed)
Patient off unit to CT

## 2018-04-30 NOTE — ED Provider Notes (Signed)
[facility] Emergency Department Provider Note ____________________________________________   First MD Initiated Contact with Patient 04/30/18 1700     (approximate)  I have reviewed the triage vital signs and the nursing notes.   HISTORY  Chief Complaint Abdominal Pain    HPI Kathy Mccall is a 85 y.o. female with PMH as noted below who presents with lower abdominal pain, persistent course over the last several days, associated with diarrhea over the last 2 weeks, and not associated with nausea or vomiting.  The patient also reports generalized weakness.  She was seen by gastroenterology and had an x-ray done yesterday.  She was told that there was an abnormality and she needed to come to the ER for evaluation.  Past Medical History:  Diagnosis Date  . Arthritis   . Atrial fibrillation ([place])   . Congestive heart failure ([place])   . Hyperlipidemia   . Hypertension   . Rheumatic fever   . S/P aortic valve replacement    [facility] jud[facility]    Patient Active Problem List   Diagnosis Date Noted  . Bowel obstruction ([place]) 04/30/2018  . Aortic valve stenosis 12/07/2015  . SK (seborrheic keratosis) 07/05/2015  . Encounter for anticoagulation discussion and counseling 03/08/2015  . Absolute anemia 01/04/2015  . Heart valve replaced 01/04/2015  . Arthritis 01/04/2015  . Atherosclerosis of coronary artery 01/04/2015  . Bilateral cataracts 01/04/2015  . DD (diverticular disease) 01/04/2015  . Bleeding from the nose 01/04/2015  . Essential (primary) hypertension 01/04/2015  . Glaucoma 01/04/2015  . Acid reflux 01/04/2015  . Borderline diabetes 01/04/2015  . H/O acute myocardial infarction 01/04/2015  . H/O gastric ulcer 01/04/2015  . Hypothyroidism 01/04/2015  . Arthritis, degenerative 01/04/2015  . OP (osteoporosis) 01/04/2015  . Post menopausal syndrome 01/04/2015  . Basal cell papilloma 01/04/2015  . Heart valve disease 01/04/2015  . Osteoarthritis  10/16/2014  . Current use of long term anticoagulation 04/25/2012  . Hyperlipidemia 01/14/2010  . HYPERTENSION, BENIGN 01/14/2010  . CAD, NATIVE VESSEL 01/14/2010  . Atrial fibrillation ([place]) 01/14/2010  . HEART VALVE REPLACEMENT, HX OF 01/14/2010  . Chest pain, precordial 12/11/2006    Past Surgical History:  Procedure Laterality Date  . AORTIC VALVE REPLACEMENT    . UPPER GI ENDOSCOPY     01/09/08 normal esophagus, normal duodenum, non-bleeding erosive gastropathy    Prior to Admission medications   Medication Sig Start Date End Date Taking? Authorizing Provider  calcium-vitamin D (OSCAL 500/200 D-3) 500-200 MG-UNIT per tablet Take 1 tablet by mouth daily.     Yes [provider]  DAILY MULTIPLE VITAMINS tablet Take 1 tablet by mouth daily.     Yes [provider]  furosemide (LASIX) 20 MG tablet TAKE 1 TABLET BY MOUTH ONCE DAILY 11/20/17  Yes Gollan, Kathlene November, MD  latanoprost (XALATAN) 0.005 % ophthalmic solution Place 1 drop into both eyes at bedtime.    Yes [provider]  levothyroxine (SYNTHROID, LEVOTHROID) 50 MCG tablet TAKE 1 TABLET BY MOUTH ONCE DAILY 03/25/18  Yes Jerrol Banana., MD  lisinopril (PRINIVIL,ZESTRIL) 10 MG tablet TAKE 1 TABLET BY MOUTH ONCE DAILY 02/08/18  Yes Jerrol Banana., MD  omeprazole (PRILOSEC) 20 MG capsule TAKE 1 CAPSULE BY MOUTH ONCE DAILY 02/27/18  Yes Jerrol Banana., MD  PARoxetine (PAXIL) 10 MG tablet Take 1 tablet (10 mg total) by mouth daily. 04/04/18  Yes Jerrol Banana., MD  potassium chloride SA (K-DUR,KLOR-CON) 20 MEQ  tablet TAKE 1 TABLET BY MOUTH ONCE DAILY 03/25/18  Yes Minna Merritts, MD  pravastatin (PRAVACHOL) 20 MG tablet TAKE 1 TABLET BY MOUTH ONCE DAILY 09/27/17  Yes Jerrol Banana., MD  Probiotic Product (ALIGN PO) Take by mouth daily.   Yes [provider]  timolol (TIMOPTIC) 0.5 % ophthalmic solution Place 1 drop into both eyes daily.    Yes [provider]  warfarin (COUMADIN) 3 MG tablet TAKE 1 TABLET BY MOUTH ONCE DAILY Patient not taking: Reported on 04/30/2018 02/27/18   Jerrol Banana., MD    Allergies Sulfa antibiotics and Sulfonamide derivatives  Family History  Problem Relation Age of Onset  . Heart disease Mother   . Hypertension Mother   . Arthritis Father   . Heart attack Father   . Heart attack Sister   . Diabetes Sister   . Cancer Brother   . Heart attack Brother   . Arthritis Brother   . Cancer Daughter        Colon cancer  . Cerebral palsy Daughter     Social History Social History   Tobacco Use  . Smoking status: Never Smoker  . Smokeless tobacco: Never Used  . Tobacco comment: tobacco use- no   Substance Use Topics  . Alcohol use: No  . Drug use: No    Review of Systems  Constitutional: No fever. Eyes: No redness. ENT: No sore throat. Cardiovascular: Denies chest pain. Respiratory: Denies shortness of breath. Gastrointestinal: Positive for diarrhea.  Genitourinary: Negative for dysuria or frequency.  Musculoskeletal: Negative for back pain. Skin: Negative for rash. Neurological: Negative for headache.   ____________________________________________   PHYSICAL EXAM:  VITAL SIGNS: ED Triage Vitals  Enc Vitals Group     BP 04/30/18 1404 (!) 111/58     Pulse Rate 04/30/18 1404 83     Resp 04/30/18 1404 16     Temp 04/30/18 1404 97.6 F (36.4 C)     Temp Source 04/30/18 1404 Oral     SpO2 04/30/18 1404 95 %     Weight 04/30/18 1405 89 lb (40.4 kg)     Height 04/30/18 1405 5\' 1"  (1.549 m)     Head Circumference --      Peak Flow --      Pain Score 04/30/18 1409 9     Pain Loc --      Pain Edu? --      Excl. in [place]? --     Constitutional: Alert and oriented.  Weak appearing but in no acute distress. Eyes: Conjunctivae are normal.  No scleral icterus. Head: Atraumatic. Nose: No congestion/rhinnorhea. Mouth/Throat: Mucous membranes are moist.   Neck: Normal range  of motion.  Cardiovascular: Normal rate, regular rhythm. Grossly normal heart sounds.  Good peripheral circulation. Respiratory: Normal respiratory effort.  No retractions. Lungs CTAB. Gastrointestinal: Soft with moderate bilateral lower quadrant tenderness.  Mild lower abdominal distention.  Genitourinary: No flank tenderness. Musculoskeletal: No lower extremity edema.  Extremities warm and well perfused.  Neurologic:  Normal speech and language. No gross focal neurologic deficits are appreciated.  Skin:  Skin is warm and dry. No rash noted. Psychiatric: Mood and affect are normal. Speech and behavior are normal.  ____________________________________________   LABS (all labs ordered are listed, but only abnormal results are displayed)  Labs Reviewed  COMPREHENSIVE METABOLIC PANEL - Abnormal; Notable for the following components:      Result Value   Sodium 129 (*)  Chloride 97 (*)    Glucose, Bld 135 (*)    BUN 24 (*)    Calcium 8.6 (*)    Albumin 3.1 (*)    AST 51 (*)    Total Bilirubin 1.9 (*)    All other components within normal limits  CBC - Abnormal; Notable for the following components:   RBC 3.49 (*)    MCV 106.3 (*)    MCH 34.4 (*)    All other components within normal limits  URINALYSIS, COMPLETE (UACMP) WITH MICROSCOPIC - Abnormal; Notable for the following components:   Color, Urine AMBER (*)    APPearance CLEAR (*)    Specific Gravity, Urine 1.034 (*)    Ketones, ur 5 (*)    Bacteria, UA RARE (*)    All other components within normal limits  LIPASE, BLOOD  PROTIME-INR   ____________________________________________  EKG   ____________________________________________  RADIOLOGY  CT abdomen: Large bowel partial obstruction at splenic flexure  ____________________________________________   PROCEDURES  Procedure(s) performed: No  Procedures  Critical Care performed: No ____________________________________________   INITIAL IMPRESSION /  ASSESSMENT AND PLAN / ED COURSE  Pertinent labs & imaging results that were available during my care of the patient were reviewed by me and considered in my medical decision making (see chart for details).  85 year old female with PMH as noted above presents with diarrhea over the last 2 weeks, as well as lower abdominal pain over the last few days.  The patient was evaluated by her gastroenterologist and had an x-ray done yesterday.  I reviewed the past medical records in [place].  X-ray yesterday showed gaseous colonic distention in the right lower quadrant with no evidence of bowel obstruction.  On exam today the patient's vital signs are normal.  She is weak appearing but in no acute distress.  Her abdomen is soft with bilateral lower quadrant tenderness and mild distention.  Differential includes colitis, diverticulitis, bowel obstruction, volvulus.  Initial lab work-up shows hyponatremia.  Although the patient has an apparent history of CHF, she appears dehydrated rather than fluid overloaded at this time, so I will give fluids and analgesia.  We will obtain CT abdomen for further evaluation.  ----------------------------------------- 11:07 PM on 04/30/2018 -----------------------------------------  I consulted Dr. Dahlia Byes from general surgery.  He advised that the patient may need surgery although given her hyponatremia, elevated INR, and comorbidities she should be admitted to the medicine service.  I counseled the patient and family on the results of the work-up and the possibility of malignancy.  I signed the patient out to the hospitalist Dr. Jannifer Franklin at 11:05 PM. ____________________________________________   FINAL CLINICAL IMPRESSION(S) / ED DIAGNOSES  Final diagnoses:  Large bowel obstruction ([place])  Hyponatremia      NEW MEDICATIONS STARTED DURING THIS VISIT:  New Prescriptions   No medications on file     Note:  This document was prepared using Dragon voice recognition  software and may include unintentional dictation errors.    Arta Silence, MD 04/30/18 2355

## 2018-04-30 NOTE — ED Notes (Signed)
Patient bought to ed by family member with c/o of pain, as per family member here yesterday but patient does not want to take medications percribed to her, also reports decrease appetite.

## 2018-04-30 NOTE — ED Notes (Signed)
Patient drinking oral contrast.

## 2018-04-30 NOTE — ED Triage Notes (Signed)
Pt c/o abd pain since last week, was seen by PCP and followed up with GI yesterday with scheduled [place] tomorrow, per family GI thought constipation but was waiting on results. Per family pt decreased PO intake. PT at baseline, A&OXx4. PT appears fatigue. VSS

## 2018-05-01 ENCOUNTER — Ambulatory Visit: Admission: RE | Admit: 2018-05-01 | Payer: Medicare Other | Source: Ambulatory Visit

## 2018-05-01 ENCOUNTER — Inpatient Hospital Stay: Payer: Medicare Other

## 2018-05-01 ENCOUNTER — Other Ambulatory Visit: Payer: Self-pay

## 2018-05-01 DIAGNOSIS — H9190 Unspecified hearing loss, unspecified ear: Secondary | ICD-10-CM | POA: Diagnosis present

## 2018-05-01 DIAGNOSIS — I4891 Unspecified atrial fibrillation: Secondary | ICD-10-CM | POA: Diagnosis present

## 2018-05-01 DIAGNOSIS — M1990 Unspecified osteoarthritis, unspecified site: Secondary | ICD-10-CM | POA: Diagnosis present

## 2018-05-01 DIAGNOSIS — I110 Hypertensive heart disease with heart failure: Secondary | ICD-10-CM | POA: Diagnosis present

## 2018-05-01 DIAGNOSIS — C185 Malignant neoplasm of splenic flexure: Secondary | ICD-10-CM | POA: Diagnosis present

## 2018-05-01 DIAGNOSIS — E43 Unspecified severe protein-calorie malnutrition: Secondary | ICD-10-CM | POA: Diagnosis present

## 2018-05-01 DIAGNOSIS — Z7901 Long term (current) use of anticoagulants: Secondary | ICD-10-CM | POA: Diagnosis not present

## 2018-05-01 DIAGNOSIS — R1909 Other intra-abdominal and pelvic swelling, mass and lump: Secondary | ICD-10-CM | POA: Diagnosis not present

## 2018-05-01 DIAGNOSIS — E871 Hypo-osmolality and hyponatremia: Secondary | ICD-10-CM | POA: Diagnosis present

## 2018-05-01 DIAGNOSIS — E785 Hyperlipidemia, unspecified: Secondary | ICD-10-CM | POA: Diagnosis present

## 2018-05-01 DIAGNOSIS — E039 Hypothyroidism, unspecified: Secondary | ICD-10-CM | POA: Diagnosis present

## 2018-05-01 DIAGNOSIS — R54 Age-related physical debility: Secondary | ICD-10-CM | POA: Diagnosis not present

## 2018-05-01 DIAGNOSIS — Z79899 Other long term (current) drug therapy: Secondary | ICD-10-CM | POA: Diagnosis not present

## 2018-05-01 DIAGNOSIS — K862 Cyst of pancreas: Secondary | ICD-10-CM | POA: Diagnosis present

## 2018-05-01 DIAGNOSIS — I509 Heart failure, unspecified: Secondary | ICD-10-CM | POA: Diagnosis present

## 2018-05-01 DIAGNOSIS — Z515 Encounter for palliative care: Secondary | ICD-10-CM | POA: Diagnosis not present

## 2018-05-01 DIAGNOSIS — K56609 Unspecified intestinal obstruction, unspecified as to partial versus complete obstruction: Secondary | ICD-10-CM | POA: Diagnosis not present

## 2018-05-01 DIAGNOSIS — Z952 Presence of prosthetic heart valve: Secondary | ICD-10-CM | POA: Diagnosis not present

## 2018-05-01 DIAGNOSIS — Z681 Body mass index (BMI) 19 or less, adult: Secondary | ICD-10-CM | POA: Diagnosis not present

## 2018-05-01 DIAGNOSIS — Z7401 Bed confinement status: Secondary | ICD-10-CM | POA: Diagnosis not present

## 2018-05-01 DIAGNOSIS — Z66 Do not resuscitate: Secondary | ICD-10-CM | POA: Diagnosis not present

## 2018-05-01 DIAGNOSIS — Z882 Allergy status to sulfonamides status: Secondary | ICD-10-CM | POA: Diagnosis not present

## 2018-05-01 DIAGNOSIS — I2510 Atherosclerotic heart disease of native coronary artery without angina pectoris: Secondary | ICD-10-CM | POA: Diagnosis present

## 2018-05-01 DIAGNOSIS — E860 Dehydration: Secondary | ICD-10-CM | POA: Diagnosis present

## 2018-05-01 DIAGNOSIS — K863 Pseudocyst of pancreas: Secondary | ICD-10-CM | POA: Diagnosis present

## 2018-05-01 DIAGNOSIS — C787 Secondary malignant neoplasm of liver and intrahepatic bile duct: Secondary | ICD-10-CM | POA: Diagnosis present

## 2018-05-01 DIAGNOSIS — K56600 Partial intestinal obstruction, unspecified as to cause: Secondary | ICD-10-CM | POA: Diagnosis not present

## 2018-05-01 DIAGNOSIS — Z79890 Hormone replacement therapy (postmenopausal): Secondary | ICD-10-CM | POA: Diagnosis not present

## 2018-05-01 LAB — BASIC METABOLIC PANEL
Anion gap: 8 (ref 5–15)
BUN: 23 mg/dL (ref 8–23)
CO2: 23 mmol/L (ref 22–32)
Calcium: 8.2 mg/dL — ABNORMAL LOW (ref 8.9–10.3)
Chloride: 98 mmol/L (ref 98–111)
Creatinine, Ser: 0.57 mg/dL (ref 0.44–1.00)
GFR calc Af Amer: >60 mL/min (ref >60)
GFR calc non Af Amer: >60 mL/min (ref >60)
Glucose, Bld: 95 mg/dL (ref 70–99)
Potassium: 4 mmol/L (ref 3.5–5.1)
Sodium: 129 mmol/L — ABNORMAL LOW (ref 135–145)

## 2018-05-01 LAB — CBC
HCT: 31.7 % — ABNORMAL LOW (ref 36.0–46.0)
Hemoglobin: 10.7 g/dL — ABNORMAL LOW (ref 12.0–15.0)
MCH: 34.7 pg — ABNORMAL HIGH (ref 26.0–34.0)
MCHC: 33.8 g/dL (ref 30.0–36.0)
MCV: 102.9 fL — ABNORMAL HIGH (ref 80.0–100.0)
Platelets: 216 10*3/uL (ref 150–400)
RBC: 3.08 MIL/uL — ABNORMAL LOW (ref 3.87–5.11)
RDW: 14.1 % (ref 11.5–15.5)
WBC: 8.9 10*3/uL (ref 4.0–10.5)
nRBC: 0 % (ref 0.0–0.2)

## 2018-05-01 LAB — PROTIME-INR
INR: 3.95
Prothrombin Time: 38 seconds — ABNORMAL HIGH (ref 11.4–15.2)

## 2018-05-01 MED ORDER — TIMOLOL MALEATE 0.5 % OP SOLN
1.0000 [drp] | Freq: Every day | OPHTHALMIC | Status: DC
  Administered 2018-05-01 – 2018-05-03 (×3): 1 [drp] via OPHTHALMIC
  Filled 2018-05-01: qty 5

## 2018-05-01 MED ORDER — PAROXETINE HCL 10 MG PO TABS
10.0000 mg | ORAL_TABLET | Freq: Every day | ORAL | Status: DC
  Administered 2018-05-01 – 2018-05-03 (×3): 10 mg via ORAL
  Filled 2018-05-01 (×3): qty 1

## 2018-05-01 MED ORDER — SODIUM CHLORIDE 0.9 % IV SOLN
INTRAVENOUS | Status: DC
  Administered 2018-05-01 – 2018-05-02 (×3): via INTRAVENOUS

## 2018-05-01 MED ORDER — LEVOTHYROXINE SODIUM 50 MCG PO TABS
50.0000 ug | ORAL_TABLET | Freq: Every day | ORAL | Status: DC
  Administered 2018-05-02 – 2018-05-03 (×2): 50 ug via ORAL
  Filled 2018-05-01 (×2): qty 1

## 2018-05-01 MED ORDER — MORPHINE SULFATE (PF) 2 MG/ML IV SOLN
2.0000 mg | INTRAVENOUS | Status: DC | PRN
  Administered 2018-05-01 – 2018-05-03 (×5): 2 mg via INTRAVENOUS
  Filled 2018-05-01 (×5): qty 1

## 2018-05-01 MED ORDER — PANTOPRAZOLE SODIUM 20 MG PO TBEC
20.0000 mg | DELAYED_RELEASE_TABLET | Freq: Every day | ORAL | Status: DC
  Administered 2018-05-01 – 2018-05-03 (×3): 20 mg via ORAL
  Filled 2018-05-01 (×3): qty 1

## 2018-05-01 MED ORDER — ACETAMINOPHEN 650 MG RE SUPP: 650.0000 mg | Freq: Four times a day (QID) | RECTAL | Status: DC | PRN

## 2018-05-01 MED ORDER — ONDANSETRON HCL 4 MG/2ML IJ SOLN
4.0000 mg | Freq: Four times a day (QID) | INTRAMUSCULAR | Status: DC | PRN
  Administered 2018-05-01: 4 mg via INTRAVENOUS
  Filled 2018-05-01: qty 2

## 2018-05-01 MED ORDER — ONDANSETRON HCL 4 MG PO TABS: 4.0000 mg | ORAL_TABLET | Freq: Four times a day (QID) | ORAL | Status: DC | PRN

## 2018-05-01 MED ORDER — SODIUM CHLORIDE 0.9 % IV SOLN
INTRAVENOUS | Status: AC
  Administered 2018-05-01: 02:00:00 via INTRAVENOUS

## 2018-05-01 MED ORDER — ACETAMINOPHEN 325 MG PO TABS
650.0000 mg | ORAL_TABLET | Freq: Four times a day (QID) | ORAL | Status: DC | PRN
  Administered 2018-05-01: 650 mg via ORAL
  Filled 2018-05-01 (×2): qty 2

## 2018-05-01 MED ORDER — ALBUMIN HUMAN 5 % IV SOLN
12.5000 g | Freq: Once | INTRAVENOUS | Status: AC
  Administered 2018-05-01: 12.5 g via INTRAVENOUS
  Filled 2018-05-01: qty 250

## 2018-05-01 MED ORDER — LATANOPROST 0.005 % OP SOLN
1.0000 [drp] | Freq: Every day | OPHTHALMIC | Status: DC
  Administered 2018-05-01 – 2018-05-02 (×2): 1 [drp] via OPHTHALMIC
  Filled 2018-05-01: qty 2.5

## 2018-05-01 NOTE — Telephone Encounter (Signed)
Please review. Thanks!  

## 2018-05-01 NOTE — Consult Note (Signed)
[facility] SURGICAL CONSULTATION NOTE (initial) - cpt: 46270   HISTORY OF PRESENT ILLNESS (HPI):  85 y.o. female presented to [facility] ED on 04/30/18 for evaluation of lower abdominal pain. Patient is hard of hearing and granddaughter (DPOA) helps contribute history. She has had about 1 weeks of crampy lower abdominal pain with associated fatigue, weight loss (~6lbs), and decreased PO intake. She denied any associated fevers, chills, nausea, or emesis. Her last BM was on Sunday. She denied any history of similar pain. Work up in the ED was remarkable for possible malignant neoplasm of the colon at the splenic flexure with possible metastasis to the liver. There is a positive family history of colon cancer in her daughter. Of note, she is does live by herself and can preform ADLs however does require a walker for ambulation and has been weaker these last few weeks.   Surgery is consulted by hospitalist physician Dr. Lance Coon, MD in this context for evaluation and management of colonic mass.  PAST MEDICAL HISTORY (PMH):  Past Medical History:  Diagnosis Date  . Arthritis   . Atrial fibrillation ([place])   . Congestive heart failure ([place])   . Hyperlipidemia   . Hypertension   . Rheumatic fever   . S/P aortic valve replacement    [facility]     PAST SURGICAL HISTORY ([place]):  Past Surgical History:  Procedure Laterality Date  . AORTIC VALVE REPLACEMENT    . UPPER GI ENDOSCOPY     01/09/08 normal esophagus, normal duodenum, non-bleeding erosive gastropathy     MEDICATIONS:  Prior to Admission medications   Medication Sig Start Date End Date Taking? Authorizing Provider  calcium-vitamin D (OSCAL 500/200 D-3) 500-200 MG-UNIT per tablet Take 1 tablet by mouth daily.     Yes [provider]  DAILY MULTIPLE VITAMINS tablet Take 1 tablet by mouth daily.     Yes [provider]  furosemide (LASIX) 20 MG tablet TAKE 1 TABLET BY MOUTH ONCE DAILY 11/20/17  Yes Gollan,  Kathlene November, MD  latanoprost (XALATAN) 0.005 % ophthalmic solution Place 1 drop into both eyes at bedtime.    Yes [provider]  levothyroxine (SYNTHROID, LEVOTHROID) 50 MCG tablet TAKE 1 TABLET BY MOUTH ONCE DAILY 03/25/18  Yes Jerrol Banana., MD  lisinopril (PRINIVIL,ZESTRIL) 10 MG tablet TAKE 1 TABLET BY MOUTH ONCE DAILY 02/08/18  Yes Jerrol Banana., MD  omeprazole (PRILOSEC) 20 MG capsule TAKE 1 CAPSULE BY MOUTH ONCE DAILY 02/27/18  Yes Jerrol Banana., MD  PARoxetine (PAXIL) 10 MG tablet Take 1 tablet (10 mg total) by mouth daily. 04/04/18  Yes Jerrol Banana., MD  potassium chloride SA (K-DUR,KLOR-CON) 20 MEQ tablet TAKE 1 TABLET BY MOUTH ONCE DAILY 03/25/18  Yes Minna Merritts, MD  pravastatin (PRAVACHOL) 20 MG tablet TAKE 1 TABLET BY MOUTH ONCE DAILY 09/27/17  Yes Jerrol Banana., MD  Probiotic Product (ALIGN PO) Take by mouth daily.   Yes [provider]  timolol (TIMOPTIC) 0.5 % ophthalmic solution Place 1 drop into both eyes daily.    Yes [provider]  warfarin (COUMADIN) 3 MG tablet TAKE 1 TABLET BY MOUTH ONCE DAILY Patient not taking: Reported on 04/30/2018 02/27/18   Jerrol Banana., MD     ALLERGIES:  Allergies  Allergen Reactions  . Sulfa Antibiotics   . Sulfonamide Derivatives      SOCIAL HISTORY:  Social History   Socioeconomic History  .  Marital status: Widowed    Spouse name: Not on file  . Number of children: 2  . Years of education: Not on file  . Highest education level: 7th grade  Occupational History  . Not on file  Social Needs  . Financial resource strain: Not hard at all  . Food insecurity:    Worry: Never true    Inability: Never true  . Transportation needs:    Medical: No    Non-medical: No  Tobacco Use  . Smoking status: Never Smoker  . Smokeless tobacco: Never Used  . Tobacco comment: tobacco use- no   Substance and Sexual Activity  . Alcohol use: No  . Drug use: No  .  Sexual activity: Never  Lifestyle  . Physical activity:    Days per week: Not on file    Minutes per session: Not on file  . Stress: Not at all  Relationships  . Social connections:    Talks on phone: Not on file    Gets together: Not on file    Attends religious service: Not on file    Active member of club or organization: Not on file    Attends meetings of clubs or organizations: Not on file    Relationship status: Not on file  . Intimate partner violence:    Fear of current or ex partner: Not on file    Emotionally abused: Not on file    Physically abused: Not on file    Forced sexual activity: Not on file  Other Topics Concern  . Not on file  Social History Narrative   Retired. Regularly exercises.      FAMILY HISTORY:  Family History  Problem Relation Age of Onset  . Heart disease Mother   . Hypertension Mother   . Arthritis Father   . Heart attack Father   . Heart attack Sister   . Diabetes Sister   . Cancer Brother   . Heart attack Brother   . Arthritis Brother   . Cancer Daughter        Colon cancer  . Cerebral palsy Daughter       REVIEW OF SYSTEMS:  Review of Systems  Constitutional: Positive for malaise/fatigue and weight loss. Negative for chills and fever.  Respiratory: Negative for cough and shortness of breath.   Cardiovascular: Negative for chest pain and palpitations.  Gastrointestinal: Positive for abdominal pain and constipation. Negative for diarrhea, nausea and vomiting.  Genitourinary: Negative for dysuria and urgency.  Neurological: Negative for dizziness and headaches.  All other systems reviewed and are negative.   VITAL SIGNS:  Temp:  [97.6 F (36.4 C)-98.6 F (37 C)] 98.1 F (36.7 C) (01/08 0525) Pulse Rate:  [80-94] 84 (01/08 0525) Resp:  [16-20] 16 (01/08 0525) BP: (106-121)/(58-82) 106/60 (01/08 0525) SpO2:  [95 %-97 %] 95 % (01/08 0525) Weight:  [40.4 kg-41.1 kg] 41.1 kg (01/08 0100)     Height: 5\' 2"  (157.5 cm) Weight:  41.1 kg BMI (Calculated): 16.57   INTAKE/OUTPUT:  This shift: No intake/output data recorded.  Last 2 shifts: @IOLAST2SHIFTS @   PHYSICAL EXAM:  Physical Exam Vitals signs and nursing note reviewed.  Constitutional:      General: She is not in acute distress.    Appearance: She is underweight. She is not ill-appearing.  Eyes:     General: No scleral icterus.    Extraocular Movements: Extraocular movements intact.  Cardiovascular:     Rate and Rhythm: Normal rate and regular rhythm.  Heart sounds: Murmur present.  Pulmonary:     Effort: Pulmonary effort is normal. No respiratory distress.     Breath sounds: Normal breath sounds. No wheezing or rhonchi.  Abdominal:     General: There is no distension.     Palpations: Abdomen is soft.     Tenderness: There is abdominal tenderness in the right lower quadrant, suprapubic area and left lower quadrant. There is no guarding or rebound.  Skin:    General: Skin is warm and dry.     Coloration: Skin is not jaundiced or pale.  Neurological:     General: No focal deficit present.     Mental Status: She is alert.  Psychiatric:        Mood and Affect: Mood normal.        Behavior: Behavior normal.       Labs:  CBC Latest Ref Rng & Units 05/01/2018 04/30/2018 04/25/2018  WBC 4.0 - 10.5 K/uL 8.9 8.3 7.3  Hemoglobin 12.0 - 15.0 g/dL 10.7(L) 12.0 13.5  Hematocrit 36.0 - 46.0 % 31.7(L) 37.1 38.9  Platelets 150 - 400 K/uL 216 224 233   CMP Latest Ref Rng & Units 05/01/2018 04/30/2018 04/25/2018  Glucose 70 - 99 mg/dL 95 135(H) 118(H)  BUN 8 - 23 mg/dL 23 24(H) 14  Creatinine 0.44 - 1.00 mg/dL 0.57 0.57 0.66  Sodium 135 - 145 mmol/L 129(L) 129(L) 134  Potassium 3.5 - 5.1 mmol/L 4.0 4.8 4.2  Chloride 98 - 111 mmol/L 98 97(L) 94(L)  CO2 22 - 32 mmol/L 23 25 22   Calcium 8.9 - 10.3 mg/dL 8.2(L) 8.6(L) 9.6  Total Protein 6.5 - 8.1 g/dL - 6.5 7.3  Total Bilirubin 0.3 - 1.2 mg/dL - 1.9(H) 1.4(H)  Alkaline Phos 38 - 126 U/L - 106 136(H)  AST 15 -  41 U/L - 51(H) 36  ALT 0 - 44 U/L - 22 19    Imaging studies:   -- CT Abdomen/Pelvis on 05/01/18 IMPRESSION: 1. Cystic masses of the body and tail of the pancreas measuring 2 x 3.4 x 1.9 cm and 1.3 x 0.7 x 1 cm. These could represent pseudocysts versus cystic neoplasms of the pancreas among some possibilities. Pancreatic MRI may help for further assessment. 2. Short segmental annular constricting abnormality of the colon at the splenic flexure suspicious for colonic neoplasm or short segmental colitis contributing to large bowel dilatation. Ill-defined hypodense lesions of the liver are new since prior chest CT from 2008. Findings are concerning for hepatic metastasis given their non simple cystic appearance. Mid to distal small bowel loops are distended with fluid may be secondary to incompetence of the ileocecal valve and from the early partial or large bowel obstruction. 3. Uncomplicated cholelithiasis. 4. Aortoiliac atherosclerosis with ectasia of the abdominal aorta. 5. Mild soft tissue anasarca. 6. Advanced osteoarthritic change of both hips. 7. Aneurysmal dilatation of the ascending thoracic aorta to 4.3 cm. Recommend annual imaging followup by CTA or MRA. This recommendation follows 2010 ACCF/AHA/AATS/ACR/ASA/SCA/SCAI/SIR/STS/SVM Guidelines for the Diagnosis and Management of Patients with Thoracic Aortic Disease. 2010; 121: O242-P536. 8. Stable marked cardiomegaly.  -- KUB on 05/01/18:  IMPRESSION: 1. Unchanged small and large bowel dilatation related to a left colonic transitioned by prior CT. 2. Cardiomegaly and small to moderate right pleural effusion.    Assessment/Plan: (ICD-10's: K20.5) 85 y.o. female with hyponatremia and supratherapeutic INR in the setting of colonic mass and possible hepatic lesions via radiographic imaging, complicated by pertinent comorbidities including aortic stenosis s/p aortic  valve replacement, atrial fibrillation on coumadin, CHF, HTN, HLD, and  advanced age.   - NPO + IVF  - Continue pain control PRN  - Monitor abdominal examination and on-going bowel function  - Patient would likely benefit from left colectomy and likely colostomy. The details of this were discussed with the patient's granddaughter (DPOA). Given her medical history and currently condition, she is a poor surgical candidate and would need optimization. The patient's granddaughter would like time to discuss her options with family but she is leaning toward deferring surgical intervention.    - Medical management of comorbidities per primary team    All of the above findings and recommendations were discussed with the patient and her family, and all of patient's and her family's questions were answered to their expressed satisfaction.  Thank you for the opportunity to participate in this patient's care.   -- Edison Simon, PA-C [facility] 05/01/2018, 10:05 AM [phone] M-F: 7am - 4pm

## 2018-05-01 NOTE — Progress Notes (Signed)
ANTICOAGULATION CONSULT NOTE - Initial Consult  Pharmacy Consult for heparin drip Indication: aortic valve replacement, possible surgical intervention  Allergies  Allergen Reactions  . Sulfa Antibiotics   . Sulfonamide Derivatives     Patient Measurements: Height: 5\' 1"  (154.9 cm) Weight: 89 lb (40.4 kg) IBW/kg (Calculated) : 47.8 Heparin Dosing Weight: 40 kg  Vital Signs: Temp: 98.2 F (36.8 C) (01/08 0032) Temp Source: Oral (01/08 0032) BP: 121/82 (01/08 0032) Pulse Rate: 94 (01/08 0032)  Labs: Recent Labs    04/29/18 1550 04/30/18 1415 04/30/18 2310  HGB  --  12.0  --   HCT  --  37.1  --   PLT  --  224  --   LABPROT  --   --  38.0*  INR 5.0*  --  3.95  CREATININE  --  0.57  --     Estimated Creatinine Clearance: 28.6 mL/min (by C-G formula based on SCr of 0.57 mg/dL).   Medical History: Past Medical History:  Diagnosis Date  . Arthritis   . Atrial fibrillation ([place])   . Congestive heart failure ([place])   . Hyperlipidemia   . Hypertension   . Rheumatic fever   . S/P aortic valve replacement    [facility]    Medications:  On warfarin as outpatient  Assessment: INR supratherapeutic on admission  Goal of Therapy:  Heparin level 0.3-0.7 units/ml Monitor platelets by anticoagulation protocol: Yes   Plan:  INR elevated on admission. Will hold off starting heparin drip until INR <2.. Recheck INR with 1/9 AM labs. Hospitalist agreeable with plan.  Kalissa Grays S 05/01/2018,1:18 AM

## 2018-05-01 NOTE — H&P (Addendum)
[facility] [place] NAME: Kathy Mccall    MR#:  [id number]  DATE OF BIRTH:  March 10, 1924  DATE OF ADMISSION:  04/30/2018  PRIMARY CARE PHYSICIAN: Jerrol Banana., MD   REQUESTING/REFERRING PHYSICIAN: Cherylann Banas, MD  CHIEF COMPLAINT:   Chief Complaint  Patient presents with  . Abdominal Pain    HISTORY OF PRESENT ILLNESS:  Kathy Mccall  is a 85 y.o. female who presents with chief complaint as above.  Patient presents to the ED brought by family due to concern for complaint of abdominal pain and decreased appetite.  Patient is hard of hearing which makes it difficult for her to contribute much information to the HPI.  68 of HPI is provided by her granddaughter who is at bedside.  Work-up here in the ED included imaging which showed partial bowel obstruction and an area concerning for possible colonic neoplasm as well as possible hepatic lesions.  Hospitalist were called for admission and further evaluation  PAST MEDICAL HISTORY:   Past Medical History:  Diagnosis Date  . Arthritis   . Atrial fibrillation ([place])   . Congestive heart failure ([place])   . Hyperlipidemia   . Hypertension   . Rheumatic fever   . S/P aortic valve replacement    [facility]     PAST SURGICAL HISTORY:   Past Surgical History:  Procedure Laterality Date  . AORTIC VALVE REPLACEMENT    . UPPER GI ENDOSCOPY     01/09/08 normal esophagus, normal duodenum, non-bleeding erosive gastropathy     SOCIAL HISTORY:   Social History   Tobacco Use  . Smoking status: Never Smoker  . Smokeless tobacco: Never Used  . Tobacco comment: tobacco use- no   Substance Use Topics  . Alcohol use: No     FAMILY HISTORY:   Family History  Problem Relation Age of Onset  . Heart disease Mother   . Hypertension Mother   . Arthritis Father   . Heart attack Father   . Heart attack Sister   . Diabetes Sister   . Cancer Brother   . Heart attack Brother    . Arthritis Brother   . Cancer Daughter        Colon cancer  . Cerebral palsy Daughter      DRUG ALLERGIES:   Allergies  Allergen Reactions  . Sulfa Antibiotics   . Sulfonamide Derivatives     MEDICATIONS AT HOME:   Prior to Admission medications   Medication Sig Start Date End Date Taking? Authorizing Provider  calcium-vitamin D (OSCAL 500/200 D-3) 500-200 MG-UNIT per tablet Take 1 tablet by mouth daily.     Yes [provider]  DAILY MULTIPLE VITAMINS tablet Take 1 tablet by mouth daily.     Yes [provider]  furosemide (LASIX) 20 MG tablet TAKE 1 TABLET BY MOUTH ONCE DAILY 11/20/17  Yes Gollan, Kathlene November, MD  latanoprost (XALATAN) 0.005 % ophthalmic solution Place 1 drop into both eyes at bedtime.    Yes [provider]  levothyroxine (SYNTHROID, LEVOTHROID) 50 MCG tablet TAKE 1 TABLET BY MOUTH ONCE DAILY 03/25/18  Yes Jerrol Banana., MD  lisinopril (PRINIVIL,ZESTRIL) 10 MG tablet TAKE 1 TABLET BY MOUTH ONCE DAILY 02/08/18  Yes Jerrol Banana., MD  omeprazole (PRILOSEC) 20 MG capsule TAKE 1 CAPSULE BY MOUTH ONCE DAILY 02/27/18  Yes Jerrol Banana., MD  PARoxetine (PAXIL) 10 MG tablet Take 1 tablet (10 mg  total) by mouth daily. 04/04/18  Yes Jerrol Banana., MD  potassium chloride SA (K-DUR,KLOR-CON) 20 MEQ tablet TAKE 1 TABLET BY MOUTH ONCE DAILY 03/25/18  Yes Minna Merritts, MD  pravastatin (PRAVACHOL) 20 MG tablet TAKE 1 TABLET BY MOUTH ONCE DAILY 09/27/17  Yes Jerrol Banana., MD  Probiotic Product (ALIGN PO) Take by mouth daily.   Yes [provider]  timolol (TIMOPTIC) 0.5 % ophthalmic solution Place 1 drop into both eyes daily.    Yes [provider]  warfarin (COUMADIN) 3 MG tablet TAKE 1 TABLET BY MOUTH ONCE DAILY Patient not taking: Reported on 04/30/2018 02/27/18   Jerrol Banana., MD    REVIEW OF SYSTEMS:  Review of Systems  Constitutional: Negative for chills, fever,  malaise/fatigue and weight loss.  HENT: Negative for ear pain, hearing loss and tinnitus.   Eyes: Negative for blurred vision, double vision, pain and redness.  Respiratory: Negative for cough, hemoptysis and shortness of breath.   Cardiovascular: Negative for chest pain, palpitations, orthopnea and leg swelling.  Gastrointestinal: Positive for abdominal pain. Negative for constipation, diarrhea, nausea and vomiting.  Genitourinary: Negative for dysuria, frequency and hematuria.  Musculoskeletal: Negative for back pain, joint pain and neck pain.  Skin:       No acne, rash, or lesions  Neurological: Negative for dizziness, tremors, focal weakness and weakness.  Endo/Heme/Allergies: Negative for polydipsia. Does not bruise/bleed easily.  Psychiatric/Behavioral: Negative for depression. The patient is not nervous/anxious and does not have insomnia.      VITAL SIGNS:   Vitals:   04/30/18 1404 04/30/18 1405 04/30/18 1742 04/30/18 2043  BP: (!) 111/58  110/61 118/62  Pulse: 83  86 80  Resp: 16  17 20   Temp: 97.6 F (36.4 C)  98.6 F (37 C)   TempSrc: Oral  Oral   SpO2: 95%  96% 97%  Weight:  40.4 kg    Height:  5\' 1"  (1.549 m)     Wt Readings from Last 3 Encounters:  04/30/18 40.4 kg  04/29/18 40.7 kg  04/29/18 40.8 kg    PHYSICAL EXAMINATION:  Physical Exam  Vitals reviewed. Constitutional: She is oriented to person, place, and time. She appears well-developed and well-nourished. No distress.  HENT:  Head: Normocephalic and atraumatic.  Mouth/Throat: Oropharynx is clear and moist.  Eyes: Pupils are equal, round, and reactive to light. Conjunctivae and EOM are normal. No scleral icterus.  Neck: Normal range of motion. Neck supple. No JVD present. No thyromegaly present.  Cardiovascular: Normal rate, regular rhythm and intact distal pulses. Exam reveals no gallop and no friction rub.  Murmur heard. Respiratory: Effort normal and breath sounds normal. No respiratory distress.  She has no wheezes. She has no rales.  GI: Soft. Bowel sounds are normal. She exhibits no distension. There is abdominal tenderness.  Musculoskeletal: Normal range of motion.        General: No edema.     Comments: No arthritis, no gout  Lymphadenopathy:    She has no cervical adenopathy.  Neurological: She is alert and oriented to person, place, and time. No cranial nerve deficit.  No dysarthria, no aphasia  Skin: Skin is warm and dry. No rash noted. No erythema.  Psychiatric: She has a normal mood and affect. Her behavior is normal. Judgment and thought content normal.    LABORATORY PANEL:   CBC Recent Labs  Lab 04/30/18 1415  WBC 8.3  HGB 12.0  HCT 37.1  PLT  224   ------------------------------------------------------------------------------------------------------------------  Chemistries  Recent Labs  Lab 04/30/18 1415  NA 129*  K 4.8  CL 97*  CO2 25  GLUCOSE 135*  BUN 24*  CREATININE 0.57  CALCIUM 8.6*  AST 51*  ALT 22  ALKPHOS 106  BILITOT 1.9*   ------------------------------------------------------------------------------------------------------------------  Cardiac Enzymes No results for input(s): TROPONINI in the last 168 hours. ------------------------------------------------------------------------------------------------------------------  RADIOLOGY:  Dg Abd 1 View  Result Date: 04/29/2018 CLINICAL DATA:  Lower abdominal pain, constipation, and unintended weight loss. EXAM: ABDOMEN - 1 VIEW COMPARISON:  None. FINDINGS: Gaseous colonic distention, most prominent in the cecum to 13 cm. Air-filled normal caliber small bowel in the left abdomen without obstruction. No evidence of free air. No significant formed stool in the colon. No definite radiopaque calculi, costochondral cartilage calcifications in the upper abdomen partially obscure evaluation. Scoliotic curvature and degenerative change of the spine. Advanced degenerative change of the left hip.  IMPRESSION: 1. Gaseous colonic distension, greatest in the right lower quadrant likely involving the cecum. No evidence of bowel obstruction. Consider CT for further evaluation. 2. No evidence of bowel obstruction or free air. Electronically Signed   By: Keith Rake M.D.   On: 04/29/2018 21:09   Ct Abdomen Pelvis W Contrast  Result Date: 04/30/2018 CLINICAL DATA:  Abdominal pain since last week. Gastroenteritis or colitis is suspected. EXAM: CT ABDOMEN AND PELVIS WITH CONTRAST TECHNIQUE: Multidetector CT imaging of the abdomen and pelvis was performed using the standard protocol following bolus administration of intravenous contrast. CONTRAST:  34mL OMNIPAQUE IOHEXOL 300 MG/ML  SOLN COMPARISON:  Chest CT 12/11/2006 FINDINGS: Lower chest: Redemonstration of marked cardiomegaly with biatrial enlargement. No pericardial effusion or thickening. Aneurysmal dilatation of the included ascending thoracic aorta to 4.3 cm, slightly increased from 4 cm previously. No dissection is noted. Contrast is seen within the included distal esophagus possibly from reflux or delayed emptying. Median sternotomy sutures are in place. Small to moderate right effusion is noted. Subpleural areas of fibrosis are identified at each base with mild peribronchial thickening to the right lower lobe. Hepatobiliary: New since 2008 are ill-defined hypodense masses that do not appear to represent simple cysts within the liver. Numerous gallstones are identified within the gallbladder without mural thickening. No biliary dilatation is identified. Pancreas: The distal body and tail of the pancreas are atrophic. Cystic masses of the body and tail of the pancreas are identified the largest in the body measuring 2 x 3.4 x 1.9 cm, series 2/34 and coronal image 33. A smaller cystic lesion of the tail of the pancreas is identified measuring 1.3 x 0.7 x 1 cm. These could represent pseudocysts versus cystic neoplasms of the pancreas among some  possibilities. Spleen: Normal size spleen. Adrenals/Urinary Tract: Normal bilateral adrenal glands. Mild cortical thinning of both kidneys. Symmetric pyelograms on repeat delayed imaging through the kidneys. No hydroureteronephrosis. The urinary bladder is unremarkable for the degree of distention. Stomach/Bowel: The stomach is distended with oral contrast. Normal small bowel rotation is identified. Mid to distal small bowel loops are distended with fluid possibly secondary what appears to be an early or partial large bowel obstruction and potentially an incompetent ileocecal valve. Dilatation of the cecum to 6.8 cm is identified and there is diffuse fluid-filled distention of large bowel noted to a transition point at the splenic flexure. Short segmental annular constricting abnormality with mural thickening is suggested, series 2/35 through 41 and series 7/12. Colonic neoplasm or short segmental focal colitis contributing to large bowel dilatation  is raised. Sigmoid diverticulosis without acute diverticulitis is identified. Vascular/Lymphatic: Moderate aortoiliac atherosclerosis with ectasia of the abdominal aorta. Patent branch vessels. Patent splenic and portal veins. No abdominopelvic adenopathy. No inguinal or pelvic sidewall adenopathy. Reproductive: Unremarkable Other: Mild soft tissue anasarca. Small amount ascites about the liver. Musculoskeletal: Advanced osteoarthritic change of both hips bone-on-bone apposition. Thoracolumbar spondylosis. No aggressive osseous lesions are identified. IMPRESSION: 1. Cystic masses of the body and tail of the pancreas measuring 2 x 3.4 x 1.9 cm and 1.3 x 0.7 x 1 cm. These could represent pseudocysts versus cystic neoplasms of the pancreas among some possibilities. Pancreatic MRI may help for further assessment. 2. Short segmental annular constricting abnormality of the colon at the splenic flexure suspicious for colonic neoplasm or short segmental colitis contributing to  large bowel dilatation. Ill-defined hypodense lesions of the liver are new since prior chest CT from 2008. Findings are concerning for hepatic metastasis given their non simple cystic appearance. Mid to distal small bowel loops are distended with fluid may be secondary to incompetence of the ileocecal valve and from the early partial or large bowel obstruction. 3. Uncomplicated cholelithiasis. 4. Aortoiliac atherosclerosis with ectasia of the abdominal aorta. 5. Mild soft tissue anasarca. 6. Advanced osteoarthritic change of both hips. 7. Aneurysmal dilatation of the ascending thoracic aorta to 4.3 cm. Recommend annual imaging followup by CTA or MRA. This recommendation follows 2010 ACCF/AHA/AATS/ACR/ASA/SCA/SCAI/SIR/STS/SVM Guidelines for the Diagnosis and Management of Patients with Thoracic Aortic Disease. 2010; 121: W098-J191. 8. Stable marked cardiomegaly. These results were called by telephone at the time of interpretation on 04/30/2018 at 8:33 pm to Dr. Arta Silence , who verbally acknowledged these results. Electronically Signed   By: Ashley Royalty M.D.   On: 04/30/2018 20:33    EKG:   Orders placed or performed in visit on 04/29/18  . EKG 12-Lead    IMPRESSION AND PLAN:  Principal Problem:   Bowel obstruction (HCC) -partial obstruction seen on CT imaging, suspicion for colonic neoplasm.  Her abdomen is tender in the right lower quadrant, though not significantly distended.  She has minimal bowel sounds on exam, however, she is not actively vomiting.  We will keep her n.p.o. for now for bowel rest, but will not place an NG tube at this time.  Surgery consult in place for further recommendations. Active Problems:   CAD, NATIVE VESSEL -continue home medications   Heart valve replaced -aortic valve replacement, patient is on warfarin, her INR is almost 4 tonight.  We will switch her to heparin in case she needs any surgical intervention or diagnostic procedure.   Essential (primary)  hypertension -continue home medications   Hyperlipidemia -Home dose antilipid   Hypothyroidism -home dose thyroid replacement  Chart review performed and case discussed with ED provider. Labs, imaging and/or ECG reviewed by provider and discussed with patient/family. Management plans discussed with the patient and/or family.  DVT PROPHYLAXIS: Systemic anticoagulation  GI PROPHYLAXIS:  None  ADMISSION STATUS: Inpatient     CODE STATUS: Full Advance Directive Documentation     Most Recent Value  Type of Advance Directive  Living will  Pre-existing out of facility DNR order (yellow form or pink MOST form)  -  "MOST" Form in Place?  -      TOTAL TIME TAKING CARE OF THIS PATIENT: 45 minutes.   Jannifer Franklin, Soua Caltagirone Hutchins 05/01/2018, 12:20 AM  [facility]  Office  [phone]  CC: Primary care physician; Jerrol Banana., MD  Note:  This  document was prepared using Systems analyst and may include unintentional dictation errors.

## 2018-05-01 NOTE — ED Notes (Signed)
ED TO INPATIENT HANDOFF REPORT  Name/Age/Gender Kathy Mccall 85 y.o. female  Code Status Advance Directive Documentation     Most Recent Value  Type of Advance Directive  Living will  Pre-existing out of facility DNR order (yellow form or pink MOST form)  -  "MOST" Form in Place?  -        Chief Complaint abd pain  Level of Care/Admitting Diagnosis ED Disposition    ED Disposition Condition [place]: [place] [[id number]]  Level of Care: Med-Surg [16]  Diagnosis: Bowel obstruction [facility]) [[id number]]  Admitting Physician: Lance Coon [[id number]]  Attending Physician: Lance Coon [phone]  Estimated length of stay: past midnight tomorrow  Certification:: I certify this patient will need inpatient services for at least 2 midnights  PT Class (Do Not Modify): Inpatient [101]  PT Acc Code (Do Not Modify): Private [1]       Medical History Past Medical History:  Diagnosis Date  . Arthritis   . Atrial fibrillation ([place])   . Congestive heart failure ([place])   . Hyperlipidemia   . Hypertension   . Rheumatic fever   . S/P aortic valve replacement    [facility]    Allergies Allergies  Allergen Reactions  . Sulfa Antibiotics   . Sulfonamide Derivatives     IV Location/Drains/Wounds Patient Lines/Drains/Airways Status   Active Line/Drains/Airways    Name:   Placement date:   Placement time:   Site:   Days:   Peripheral IV 04/30/18 Right Forearm   04/30/18    1703    Forearm   1          Labs/Imaging Results for orders placed or performed during the hospital encounter of 04/30/18 (from the past 48 hour(s))  Lipase, blood     Status: None   Collection Time: 04/30/18  2:15 PM  Result Value Ref Range   Lipase 22 11 - 51 U/L    Comment: Performed at [facility], [address] [place], [place] [address]  Comprehensive metabolic panel     Status: Abnormal   Collection Time: 04/30/18  2:15 PM  Result Value Ref  Range   Sodium 129 (L) 135 - 145 mmol/L   Potassium 4.8 3.5 - 5.1 mmol/L    Comment: HEMOLYSIS AT THIS LEVEL MAY AFFECT RESULT   Chloride 97 (L) 98 - 111 mmol/L   CO2 25 22 - 32 mmol/L   Glucose, Bld 135 (H) 70 - 99 mg/dL   BUN 24 (H) 8 - 23 mg/dL   Creatinine, Ser 0.57 0.44 - 1.00 mg/dL   Calcium 8.6 (L) 8.9 - 10.3 mg/dL   Total Protein 6.5 6.5 - 8.1 g/dL   Albumin 3.1 (L) 3.5 - 5.0 g/dL   AST 51 (H) 15 - 41 U/L    Comment: HEMOLYSIS AT THIS LEVEL MAY AFFECT RESULT   ALT 22 0 - 44 U/L    Comment: HEMOLYSIS AT THIS LEVEL MAY AFFECT RESULT   Alkaline Phosphatase 106 38 - 126 U/L   Total Bilirubin 1.9 (H) 0.3 - 1.2 mg/dL    Comment: HEMOLYSIS AT THIS LEVEL MAY AFFECT RESULT   GFR calc non Af Amer >60 >60 mL/min   GFR calc Af Amer >60 >60 mL/min   Anion gap 7 5 - 15    Comment: Performed at [facility], [address] [place], [place] [address]  CBC     Status: Abnormal   Collection  Time: 04/30/18  2:15 PM  Result Value Ref Range   WBC 8.3 4.0 - 10.5 K/uL   RBC 3.49 (L) 3.87 - 5.11 MIL/uL   Hemoglobin 12.0 12.0 - 15.0 g/dL   HCT 37.1 36.0 - 46.0 %   MCV 106.3 (H) 80.0 - 100.0 fL   MCH 34.4 (H) 26.0 - 34.0 pg   MCHC 32.3 30.0 - 36.0 g/dL   RDW 14.1 11.5 - 15.5 %   Platelets 224 150 - 400 K/uL   nRBC 0.0 0.0 - 0.2 %    Comment: Performed at [facility], [address] [place], [place] [address]  Urinalysis, Complete w Microscopic     Status: Abnormal   Collection Time: 04/30/18  8:42 PM  Result Value Ref Range   Color, Urine AMBER (A) YELLOW    Comment: BIOCHEMICALS MAY BE AFFECTED BY COLOR   APPearance CLEAR (A) CLEAR   Specific Gravity, Urine 1.034 (H) 1.005 - 1.030   pH 5.0 5.0 - 8.0   Glucose, UA NEGATIVE NEGATIVE mg/dL   Hgb urine dipstick NEGATIVE NEGATIVE   Bilirubin Urine NEGATIVE NEGATIVE   Ketones, ur 5 (A) NEGATIVE mg/dL   Protein, ur NEGATIVE NEGATIVE mg/dL   Nitrite NEGATIVE NEGATIVE   Leukocytes, UA NEGATIVE NEGATIVE   RBC / HPF 0-5  0 - 5 RBC/hpf   WBC, UA 6-10 0 - 5 WBC/hpf   Bacteria, UA RARE (A) NONE SEEN   Squamous Epithelial / LPF 0-5 0 - 5   Mucus PRESENT    Hyaline Casts, UA PRESENT     Comment: Performed at [facility], [address] [place], [place] [address]  Protime-INR     Status: Abnormal   Collection Time: 04/30/18 11:10 PM  Result Value Ref Range   Prothrombin Time 38.0 (H) 11.4 - 15.2 seconds   INR 3.95     Comment: Performed at [facility], [place], [place] [address]   Dg Abd 1 View  Result Date: 04/29/2018 CLINICAL DATA:  Lower abdominal pain, constipation, and unintended weight loss. EXAM: ABDOMEN - 1 VIEW COMPARISON:  None. FINDINGS: Gaseous colonic distention, most prominent in the cecum to 13 cm. Air-filled normal caliber small bowel in the left abdomen without obstruction. No evidence of free air. No significant formed stool in the colon. No definite radiopaque calculi, costochondral cartilage calcifications in the upper abdomen partially obscure evaluation. Scoliotic curvature and degenerative change of the spine. Advanced degenerative change of the left hip. IMPRESSION: 1. Gaseous colonic distension, greatest in the right lower quadrant likely involving the cecum. No evidence of bowel obstruction. Consider CT for further evaluation. 2. No evidence of bowel obstruction or free air. Electronically Signed   By: Keith Rake M.D.   On: 04/29/2018 21:09   Ct Abdomen Pelvis W Contrast  Result Date: 04/30/2018 CLINICAL DATA:  Abdominal pain since last week. Gastroenteritis or colitis is suspected. EXAM: CT ABDOMEN AND PELVIS WITH CONTRAST TECHNIQUE: Multidetector CT imaging of the abdomen and pelvis was performed using the standard protocol following bolus administration of intravenous contrast. CONTRAST:  73mL OMNIPAQUE IOHEXOL 300 MG/ML  SOLN COMPARISON:  Chest CT 12/11/2006 FINDINGS: Lower chest: Redemonstration of marked cardiomegaly with biatrial enlargement.  No pericardial effusion or thickening. Aneurysmal dilatation of the included ascending thoracic aorta to 4.3 cm, slightly increased from 4 cm previously. No dissection is noted. Contrast is seen within the included distal esophagus possibly from reflux or delayed emptying. Median sternotomy sutures are in place.  Small to moderate right effusion is noted. Subpleural areas of fibrosis are identified at each base with mild peribronchial thickening to the right lower lobe. Hepatobiliary: New since 2008 are ill-defined hypodense masses that do not appear to represent simple cysts within the liver. Numerous gallstones are identified within the gallbladder without mural thickening. No biliary dilatation is identified. Pancreas: The distal body and tail of the pancreas are atrophic. Cystic masses of the body and tail of the pancreas are identified the largest in the body measuring 2 x 3.4 x 1.9 cm, series 2/34 and coronal image 33. A smaller cystic lesion of the tail of the pancreas is identified measuring 1.3 x 0.7 x 1 cm. These could represent pseudocysts versus cystic neoplasms of the pancreas among some possibilities. Spleen: Normal size spleen. Adrenals/Urinary Tract: Normal bilateral adrenal glands. Mild cortical thinning of both kidneys. Symmetric pyelograms on repeat delayed imaging through the kidneys. No hydroureteronephrosis. The urinary bladder is unremarkable for the degree of distention. Stomach/Bowel: The stomach is distended with oral contrast. Normal small bowel rotation is identified. Mid to distal small bowel loops are distended with fluid possibly secondary what appears to be an early or partial large bowel obstruction and potentially an incompetent ileocecal valve. Dilatation of the cecum to 6.8 cm is identified and there is diffuse fluid-filled distention of large bowel noted to a transition point at the splenic flexure. Short segmental annular constricting abnormality with mural thickening is  suggested, series 2/35 through 41 and series 7/12. Colonic neoplasm or short segmental focal colitis contributing to large bowel dilatation is raised. Sigmoid diverticulosis without acute diverticulitis is identified. Vascular/Lymphatic: Moderate aortoiliac atherosclerosis with ectasia of the abdominal aorta. Patent branch vessels. Patent splenic and portal veins. No abdominopelvic adenopathy. No inguinal or pelvic sidewall adenopathy. Reproductive: Unremarkable Other: Mild soft tissue anasarca. Small amount ascites about the liver. Musculoskeletal: Advanced osteoarthritic change of both hips bone-on-bone apposition. Thoracolumbar spondylosis. No aggressive osseous lesions are identified. IMPRESSION: 1. Cystic masses of the body and tail of the pancreas measuring 2 x 3.4 x 1.9 cm and 1.3 x 0.7 x 1 cm. These could represent pseudocysts versus cystic neoplasms of the pancreas among some possibilities. Pancreatic MRI may help for further assessment. 2. Short segmental annular constricting abnormality of the colon at the splenic flexure suspicious for colonic neoplasm or short segmental colitis contributing to large bowel dilatation. Ill-defined hypodense lesions of the liver are new since prior chest CT from 2008. Findings are concerning for hepatic metastasis given their non simple cystic appearance. Mid to distal small bowel loops are distended with fluid may be secondary to incompetence of the ileocecal valve and from the early partial or large bowel obstruction. 3. Uncomplicated cholelithiasis. 4. Aortoiliac atherosclerosis with ectasia of the abdominal aorta. 5. Mild soft tissue anasarca. 6. Advanced osteoarthritic change of both hips. 7. Aneurysmal dilatation of the ascending thoracic aorta to 4.3 cm. Recommend annual imaging followup by CTA or MRA. This recommendation follows 2010 ACCF/AHA/AATS/ACR/ASA/SCA/SCAI/SIR/STS/SVM Guidelines for the Diagnosis and Management of Patients with Thoracic Aortic Disease.  2010; 121: Y694-W546. 8. Stable marked cardiomegaly. These results were called by telephone at the time of interpretation on 04/30/2018 at 8:33 pm to Dr. Arta Silence , who verbally acknowledged these results. Electronically Signed   By: Ashley Royalty M.D.   On: 04/30/2018 20:33    Pending Labs [employer] (From admission, onward)    Start     Ordered   Signed and Held  Basic metabolic panel  Tomorrow morning,  R     Signed and Held   Signed and Held  CBC  Tomorrow morning,   R     Signed and Held          Vitals/Pain Today's Vitals   04/30/18 1857 04/30/18 2043 05/01/18 0032 05/01/18 0035  BP:  118/62 121/82   Pulse:  80 94   Resp:  20 18   Temp:   98.2 F (36.8 C)   TempSrc:   Oral   SpO2:  97% 95%   Weight:      Height:      PainSc: 5  4   3      Isolation Precautions No active isolations  Medications Medications  iopamidol (ISOVUE-300) 61 % injection 15 mL (15 mLs Oral Contrast Given 04/30/18 1732)  sodium chloride 0.9 % bolus 500 mL (0 mLs Intravenous Stopped 04/30/18 1857)  morphine 2 MG/ML injection 2 mg (2 mg Intravenous Given 04/30/18 1737)  iohexol (OMNIPAQUE) 300 MG/ML solution 75 mL (75 mLs Intravenous Contrast Given 04/30/18 1957)

## 2018-05-01 NOTE — Telephone Encounter (Signed)
Patient was admitted to [facility] last night for large bowel obstruction per ED note and her appt for today has been cancelled for ultrasound.

## 2018-05-01 NOTE — Progress Notes (Signed)
[place] [facility] [place] NAME: Ernest Orr    MR#:  [id number]  DATE OF BIRTH:  29-Oct-1923  SUBJECTIVE:  CHIEF COMPLAINT:   Chief Complaint  Patient presents with  . Abdominal Pain   Patient admitted with complaints of abdominal pains.  Improving but still has some abdominal pains.  No further nausea or vomiting reported .  Updated daughter present at bedside on treatment plans.  REVIEW OF SYSTEMS:  ROS  DRUG ALLERGIES:   Allergies  Allergen Reactions  . Sulfa Antibiotics   . Sulfonamide Derivatives    VITALS:  Blood pressure 98/63, pulse 84, temperature 98.2 F (36.8 C), temperature source Oral, resp. rate 19, height 5\' 2"  (1.575 m), weight 41.1 kg, SpO2 96 %. PHYSICAL EXAMINATION:  Physical Exam LABORATORY PANEL:  Female CBC Recent Labs  Lab 05/01/18 0419  WBC 8.9  HGB 10.7*  HCT 31.7*  PLT 216   ------------------------------------------------------------------------------------------------------------------ Chemistries  Recent Labs  Lab 04/30/18 1415 05/01/18 0419  NA 129* 129*  K 4.8 4.0  CL 97* 98  CO2 25 23  GLUCOSE 135* 95  BUN 24* 23  CREATININE 0.57 0.57  CALCIUM 8.6* 8.2*  AST 51*  --   ALT 22  --   ALKPHOS 106  --   BILITOT 1.9*  --    RADIOLOGY:  US Pelvis (transabdominal Only)  Result Date: 05/01/2018 CLINICAL DATA:  Lower abdominal pain, weight loss. EXAM: TRANSABDOMINAL ULTRASOUND OF PELVIS TECHNIQUE: Transabdominal ultrasound examination of the pelvis was performed including evaluation of the uterus, ovaries, adnexal regions, and pelvic cul-de-sac. COMPARISON:  CT scan of April 30, 2018. FINDINGS: Uterus Measurements: 2.1 x 1.8 x 1.1 cm = volume: 2 mL. No fibroids or other mass visualized. Endometrium Thickness: 2 mm which is within normal limits. No focal abnormality visualized. Right ovary Not visualized. Left ovary Not visualized. Other findings:  No abnormal free fluid. IMPRESSION: Ovaries  are not visualized. No definite adnexal mass is noted. Uterine atrophy is noted consistent with patient's age. No definite abnormality seen in the pelvis. Electronically Signed   By: Marijo Conception, M.D.   On: 05/01/2018 11:59   Ct Abdomen Pelvis W Contrast  Result Date: 04/30/2018 CLINICAL DATA:  Abdominal pain since last week. Gastroenteritis or colitis is suspected. EXAM: CT ABDOMEN AND PELVIS WITH CONTRAST TECHNIQUE: Multidetector CT imaging of the abdomen and pelvis was performed using the standard protocol following bolus administration of intravenous contrast. CONTRAST:  72mL OMNIPAQUE IOHEXOL 300 MG/ML  SOLN COMPARISON:  Chest CT 12/11/2006 FINDINGS: Lower chest: Redemonstration of marked cardiomegaly with biatrial enlargement. No pericardial effusion or thickening. Aneurysmal dilatation of the included ascending thoracic aorta to 4.3 cm, slightly increased from 4 cm previously. No dissection is noted. Contrast is seen within the included distal esophagus possibly from reflux or delayed emptying. Median sternotomy sutures are in place. Small to moderate right effusion is noted. Subpleural areas of fibrosis are identified at each base with mild peribronchial thickening to the right lower lobe. Hepatobiliary: New since 2008 are ill-defined hypodense masses that do not appear to represent simple cysts within the liver. Numerous gallstones are identified within the gallbladder without mural thickening. No biliary dilatation is identified. Pancreas: The distal body and tail of the pancreas are atrophic. Cystic masses of the body and tail of the pancreas are identified the largest in the body measuring 2 x 3.4 x 1.9 cm, series 2/34 and coronal image 33. A smaller cystic lesion of  the tail of the pancreas is identified measuring 1.3 x 0.7 x 1 cm. These could represent pseudocysts versus cystic neoplasms of the pancreas among some possibilities. Spleen: Normal size spleen. Adrenals/Urinary Tract: Normal  bilateral adrenal glands. Mild cortical thinning of both kidneys. Symmetric pyelograms on repeat delayed imaging through the kidneys. No hydroureteronephrosis. The urinary bladder is unremarkable for the degree of distention. Stomach/Bowel: The stomach is distended with oral contrast. Normal small bowel rotation is identified. Mid to distal small bowel loops are distended with fluid possibly secondary what appears to be an early or partial large bowel obstruction and potentially an incompetent ileocecal valve. Dilatation of the cecum to 6.8 cm is identified and there is diffuse fluid-filled distention of large bowel noted to a transition point at the splenic flexure. Short segmental annular constricting abnormality with mural thickening is suggested, series 2/35 through 41 and series 7/12. Colonic neoplasm or short segmental focal colitis contributing to large bowel dilatation is raised. Sigmoid diverticulosis without acute diverticulitis is identified. Vascular/Lymphatic: Moderate aortoiliac atherosclerosis with ectasia of the abdominal aorta. Patent branch vessels. Patent splenic and portal veins. No abdominopelvic adenopathy. No inguinal or pelvic sidewall adenopathy. Reproductive: Unremarkable Other: Mild soft tissue anasarca. Small amount ascites about the liver. Musculoskeletal: Advanced osteoarthritic change of both hips bone-on-bone apposition. Thoracolumbar spondylosis. No aggressive osseous lesions are identified. IMPRESSION: 1. Cystic masses of the body and tail of the pancreas measuring 2 x 3.4 x 1.9 cm and 1.3 x 0.7 x 1 cm. These could represent pseudocysts versus cystic neoplasms of the pancreas among some possibilities. Pancreatic MRI may help for further assessment. 2. Short segmental annular constricting abnormality of the colon at the splenic flexure suspicious for colonic neoplasm or short segmental colitis contributing to large bowel dilatation. Ill-defined hypodense lesions of the liver are new  since prior chest CT from 2008. Findings are concerning for hepatic metastasis given their non simple cystic appearance. Mid to distal small bowel loops are distended with fluid may be secondary to incompetence of the ileocecal valve and from the early partial or large bowel obstruction. 3. Uncomplicated cholelithiasis. 4. Aortoiliac atherosclerosis with ectasia of the abdominal aorta. 5. Mild soft tissue anasarca. 6. Advanced osteoarthritic change of both hips. 7. Aneurysmal dilatation of the ascending thoracic aorta to 4.3 cm. Recommend annual imaging followup by CTA or MRA. This recommendation follows 2010 ACCF/AHA/AATS/ACR/ASA/SCA/SCAI/SIR/STS/SVM Guidelines for the Diagnosis and Management of Patients with Thoracic Aortic Disease. 2010; 121: I338-S505. 8. Stable marked cardiomegaly. These results were called by telephone at the time of interpretation on 04/30/2018 at 8:33 pm to Dr. Arta Silence , who verbally acknowledged these results. Electronically Signed   By: Ashley Royalty M.D.   On: 04/30/2018 20:33   Dg Abd Portable 2v  Result Date: 05/01/2018 CLINICAL DATA:  Follow-up small bowel obstruction EXAM: PORTABLE ABDOMEN - 2 VIEW COMPARISON:  CT from yesterday FINDINGS: Continued small and large bowel dilatation related to a left colonic transition point by prior CT. Cardiomegaly and small to moderate right pleural effusion. IMPRESSION: 1. Unchanged small and large bowel dilatation related to a left colonic transitioned by prior CT. 2. Cardiomegaly and small to moderate right pleural effusion. Electronically Signed   By: Monte Fantasia M.D.   On: 05/01/2018 07:34   ASSESSMENT AND PLAN:    1. Bowel obstruction (HCC) -partial obstruction seen on CT imaging, suspicion for colonic neoplasm. Abdominal pains appear improved this morning but still has some pains. Patient evaluated by surgeon today.  Patient has near obstructing  splenic lesion of the colon with mets to the liver concerning for malignancy.   No tissue diagnosis yet.  Patient also has evidence of cystic mass in the body and tail of pancreas which could represent pseudocyst versus cystic neoplasm of the pancreas.  Discussed MRI for further evaluation with granddaughter who is healthcare power of attorney.  They do not wish to be aggressive at this time given patient's advanced age and comorbidities.  Patient does have significant weight loss and severely malnourished surgeon discussed extensively with patient's granddaughter who is also healthcare power of attorney.  Including risks and benefits of surgery due to patient's advanced age.  They do not want any major intervention at this time and are considering palliative care. Surgeon consulted palliative care consult and will follow in the interim. Patient currently n.p.o. with sips for meds  2. CAD, NATIVE VESSEL -continue home medications   Heart valve replaced -aortic valve replacement, patient is on warfarin, her INR noted to be 3.95.  We will follow-up on INR in a.m.  Plan to resume Coumadin since no plans for surgery once INR less than 3.5   3. Essential (primary) hypertension -continue home medications    4.  Hypothyroidism -continue home dose thyroid replacement TSH level in a.m.  5.  Hyponatremia with sodium of 129. Likely due to dehydration. Gentle IV fluid hydration.  Follow-up sodium level in a.m.  DVT prophylaxis; Coumadin currently on hold due to supratherapeutic INR  All the records are reviewed and case discussed with Care Management/Social Worker. Management plans discussed with the patient, family and they are in agreement.  CODE STATUS: Full Code  TOTAL TIME TAKING CARE OF THIS PATIENT: 36 minutes.   More than 50% of the time was spent in counseling/coordination of care: YES  POSSIBLE D/C IN 2 DAYS, DEPENDING ON CLINICAL CONDITION.   Lillieann Pavlich M.D on 05/01/2018 at 2:49 PM  Between 7am to 6pm - Pager - [phone]  After 6pm go to [email or web address] -  password [email or web address] [facility]  [employer] [facility] [facility]  Office  [phone]  CC: Primary care physician; Jerrol Banana., MD  Note: This dictation was prepared with Dragon dictation along with smaller phrase technology. Any transcriptional errors that result from this process are unintentional.

## 2018-05-02 ENCOUNTER — Inpatient Hospital Stay: Payer: Medicare Other

## 2018-05-02 DIAGNOSIS — E43 Unspecified severe protein-calorie malnutrition: Secondary | ICD-10-CM

## 2018-05-02 DIAGNOSIS — R1909 Other intra-abdominal and pelvic swelling, mass and lump: Secondary | ICD-10-CM

## 2018-05-02 DIAGNOSIS — K6389 Other specified diseases of intestine: Secondary | ICD-10-CM

## 2018-05-02 DIAGNOSIS — Z515 Encounter for palliative care: Secondary | ICD-10-CM

## 2018-05-02 DIAGNOSIS — K56609 Unspecified intestinal obstruction, unspecified as to partial versus complete obstruction: Secondary | ICD-10-CM

## 2018-05-02 LAB — COMPREHENSIVE METABOLIC PANEL
ALT: 16 U/L (ref 0–44)
AST: 27 U/L (ref 15–41)
Albumin: 2.6 g/dL — ABNORMAL LOW (ref 3.5–5.0)
Alkaline Phosphatase: 73 U/L (ref 38–126)
Anion gap: 12 (ref 5–15)
BUN: 29 mg/dL — ABNORMAL HIGH (ref 8–23)
CO2: 17 mmol/L — ABNORMAL LOW (ref 22–32)
CREATININE: 1.03 mg/dL — AB (ref 0.44–1.00)
Calcium: 7.8 mg/dL — ABNORMAL LOW (ref 8.9–10.3)
Chloride: 102 mmol/L (ref 98–111)
GFR calc Af Amer: 55 mL/min — ABNORMAL LOW (ref >60)
GFR calc non Af Amer: 47 mL/min — ABNORMAL LOW (ref >60)
Glucose, Bld: 78 mg/dL (ref 70–99)
Potassium: 3.8 mmol/L (ref 3.5–5.1)
Sodium: 131 mmol/L — ABNORMAL LOW (ref 135–145)
Total Bilirubin: 2.4 mg/dL — ABNORMAL HIGH (ref 0.3–1.2)
Total Protein: 5.4 g/dL — ABNORMAL LOW (ref 6.5–8.1)

## 2018-05-02 LAB — MAGNESIUM: Magnesium: 1.8 mg/dL (ref 1.7–2.4)

## 2018-05-02 LAB — CBC
HCT: 29.3 % — ABNORMAL LOW (ref 36.0–46.0)
Hemoglobin: 9.7 g/dL — ABNORMAL LOW (ref 12.0–15.0)
MCH: 34.5 pg — ABNORMAL HIGH (ref 26.0–34.0)
MCHC: 33.1 g/dL (ref 30.0–36.0)
MCV: 104.3 fL — ABNORMAL HIGH (ref 80.0–100.0)
Platelets: 195 10*3/uL (ref 150–400)
RBC: 2.81 MIL/uL — ABNORMAL LOW (ref 3.87–5.11)
RDW: 14.2 % (ref 11.5–15.5)
WBC: 9.2 10*3/uL (ref 4.0–10.5)
nRBC: 0 % (ref 0.0–0.2)

## 2018-05-02 LAB — PROTIME-INR
INR: 5.38 — AB
PROTHROMBIN TIME: 48.3 s — AB (ref 11.4–15.2)

## 2018-05-02 LAB — PHOSPHORUS: PHOSPHORUS: 3.8 mg/dL (ref 2.5–4.6)

## 2018-05-02 NOTE — Consult Note (Signed)
[facility] NOTE  Patient Care Team: Jerrol Banana., MD as PCP - General (Unknown Physician Specialty) Minna Merritts, MD as Consulting Physician (Cardiology) Lorelee Cover., MD as Consulting Physician (Ophthalmology)  CHIEF COMPLAINTS/PURPOSE OF CONSULTATION:  Colon mass  HISTORY OF PRESENTING ILLNESS:  Kathy Mccall 85 y.o.  female history of aortic valve replacement on Coumadin/A. Fib-is currently admitted to hospital for worsening abdominal pain abdominal distention/poor appetite/positive weight loss.  CT scan in the emergency room showed near obstructing splenic flexure mass with resultant colonic distention.  Also notes to have 2 to 3 cm pancreatic cystic masses.  Also showed multiple hypoechoic lesions concerning for metastases in the liver [new from 2008].  Clinical picture highly suggestive of malignancy metastatic to liver.  Patient has not had previous colonoscopies recently/given anticoagulation.  Patient's daughter of colon cancer at age of 72.  Patient has been evaluated by surgery-and is thought to be a poor surgical candidate given her advanced age frailty/debility/anticoagulation etc.  Patient family understands the difficult situation.  Oncology is been consulted for further recommendations.   Review of Systems  Unable to perform ROS: Other (Patient drowsy/sleepy)     MEDICAL HISTORY:  Past Medical History:  Diagnosis Date  . Arthritis   . Atrial fibrillation ([place])   . Congestive heart failure ([place])   . Hyperlipidemia   . Hypertension   . Rheumatic fever   . S/P aortic valve replacement    [facility]    SURGICAL HISTORY: Past Surgical History:  Procedure Laterality Date  . AORTIC VALVE REPLACEMENT    . UPPER GI ENDOSCOPY     01/09/08 normal esophagus, normal duodenum, non-bleeding erosive gastropathy    SOCIAL HISTORY: Social History   Socioeconomic History  . Marital status: Widowed    Spouse name: Not on file  .  Number of children: 2  . Years of education: Not on file  . Highest education level: 7th grade  Occupational History  . Not on file  Social Needs  . Financial resource strain: Not hard at all  . Food insecurity:    Worry: Never true    Inability: Never true  . Transportation needs:    Medical: No    Non-medical: No  Tobacco Use  . Smoking status: Never Smoker  . Smokeless tobacco: Never Used  . Tobacco comment: tobacco use- no   Substance and Sexual Activity  . Alcohol use: No  . Drug use: No  . Sexual activity: Never  Lifestyle  . Physical activity:    Days per week: Not on file    Minutes per session: Not on file  . Stress: Not at all  Relationships  . Social connections:    Talks on phone: Not on file    Gets together: Not on file    Attends religious service: Not on file    Active member of club or organization: Not on file    Attends meetings of clubs or organizations: Not on file    Relationship status: Not on file  . Intimate partner violence:    Fear of current or ex partner: Not on file    Emotionally abused: Not on file    Physically abused: Not on file    Forced sexual activity: Not on file  Other Topics Concern  . Not on file  Social History Narrative   Retired. Regularly exercises.     FAMILY HISTORY: Family History  Problem Relation Age of Onset  .  Heart disease Mother   . Hypertension Mother   . Arthritis Father   . Heart attack Father   . Heart attack Sister   . Diabetes Sister   . Cancer Brother   . Heart attack Brother   . Arthritis Brother   . Cancer Daughter        Colon cancer  . Cerebral palsy Daughter     ALLERGIES:  is allergic to sulfa antibiotics and sulfonamide derivatives.  MEDICATIONS:  Current Facility-Administered Medications  Medication Dose Route Frequency Provider Last Rate Last Dose  . 0.9 %  sodium chloride infusion   Intravenous Continuous Ojie, Jude, MD 75 mL/hr at 05/02/18 3419    . acetaminophen (TYLENOL)  tablet 650 mg  650 mg Oral Q6H PRN Lance Coon, MD   650 mg at 05/01/18 2133   Or  . acetaminophen (TYLENOL) suppository 650 mg  650 mg Rectal Q6H PRN Lance Coon, MD      . latanoprost (XALATAN) 0.005 % ophthalmic solution 1 drop  1 drop Both Eyes QHS Lance Coon, MD   1 drop at 05/01/18 2125  . levothyroxine (SYNTHROID, LEVOTHROID) tablet 50 mcg  50 mcg Oral QAC breakfast Lance Coon, MD   50 mcg at 05/02/18 0541  . morphine 2 MG/ML injection 2 mg  2 mg Intravenous Q4H PRN Lance Coon, MD   2 mg at 05/02/18 1000  . ondansetron (ZOFRAN) tablet 4 mg  4 mg Oral Q6H PRN Lance Coon, MD       Or  . ondansetron [facility]) injection 4 mg  4 mg Intravenous Q6H PRN Lance Coon, MD   4 mg at 05/01/18 2133  . pantoprazole (PROTONIX) EC tablet 20 mg  20 mg Oral Daily Lance Coon, MD   20 mg at 05/02/18 0959  . PARoxetine (PAXIL) tablet 10 mg  10 mg Oral Daily Lance Coon, MD   10 mg at 05/02/18 0959  . timolol (TIMOPTIC) 0.5 % ophthalmic solution 1 drop  1 drop Both Eyes Daily Lance Coon, MD   1 drop at 05/02/18 1000      .  PHYSICAL EXAMINATION:  Vitals:   05/02/18 0523 05/02/18 0616  BP: (!) 87/52 100/60  Pulse: (!) 103 68  Resp: 20   Temp: 98.7 F (37.1 C)   SpO2: 93%    Filed Weights   04/30/18 1405 05/01/18 0100  Weight: 89 lb (40.4 kg) 90 lb 9.7 oz (41.1 kg)    Physical Exam  Constitutional:  Frail-appearing Caucasian female patient cachectic.  She is sleepy.  Accompanied by her granddaughter.  HENT:  Head: Normocephalic and atraumatic.  Mouth/Throat: Oropharynx is clear and moist. No oropharyngeal exudate.  Eyes: Pupils are equal, round, and reactive to light.  Neck: Normal range of motion. Neck supple.  Cardiovascular: Normal rate and regular rhythm.  Pulmonary/Chest: No respiratory distress. She has no wheezes.  Decreased breath sounds bilaterally.  Abdominal: Soft. Bowel sounds are normal. She exhibits no distension and no mass. There is no abdominal  tenderness. There is no rebound and no guarding.  Musculoskeletal: Normal range of motion.        General: No tenderness or edema.  Neurological:  Patient sleepy/drowsy (question pain medication); otherwise arousable.  Skin: Skin is warm.     LABORATORY DATA:  I have reviewed the data as listed Lab Results  Component Value Date   WBC 9.2 05/02/2018   HGB 9.7 (L) 05/02/2018   HCT 29.3 (L) 05/02/2018   MCV 104.3 (H) 05/02/2018  PLT 195 05/02/2018   Recent Labs    04/25/18 1216 04/30/18 1415 05/01/18 0419 05/02/18 0446  NA 134 129* 129* 131*  K 4.2 4.8 4.0 3.8  CL 94* 97* 98 102  CO2 22 25 23  17*  GLUCOSE 118* 135* 95 78  BUN 14 24* 23 29*  CREATININE 0.66 0.57 0.57 1.03*  CALCIUM 9.6 8.6* 8.2* 7.8*  GFRNONAA 77 >60 >60 47*  GFRAA 89 >60 >60 55*  PROT 7.3 6.5  --  5.4*  ALBUMIN 3.7 3.1*  --  2.6*  AST 36 51*  --  27  ALT 19 22  --  16  ALKPHOS 136* 106  --  73  BILITOT 1.4* 1.9*  --  2.4*    RADIOGRAPHIC STUDIES: I have personally reviewed the radiological images as listed and agreed with the findings in the report. Dg Abd 1 View  Result Date: 04/29/2018 CLINICAL DATA:  Lower abdominal pain, constipation, and unintended weight loss. EXAM: ABDOMEN - 1 VIEW COMPARISON:  None. FINDINGS: Gaseous colonic distention, most prominent in the cecum to 13 cm. Air-filled normal caliber small bowel in the left abdomen without obstruction. No evidence of free air. No significant formed stool in the colon. No definite radiopaque calculi, costochondral cartilage calcifications in the upper abdomen partially obscure evaluation. Scoliotic curvature and degenerative change of the spine. Advanced degenerative change of the left hip. IMPRESSION: 1. Gaseous colonic distension, greatest in the right lower quadrant likely involving the cecum. No evidence of bowel obstruction. Consider CT for further evaluation. 2. No evidence of bowel obstruction or free air. Electronically Signed   By: Keith Rake M.D.   On: 04/29/2018 21:09   US Pelvis (transabdominal Only)  Result Date: 05/01/2018 CLINICAL DATA:  Lower abdominal pain, weight loss. EXAM: TRANSABDOMINAL ULTRASOUND OF PELVIS TECHNIQUE: Transabdominal ultrasound examination of the pelvis was performed including evaluation of the uterus, ovaries, adnexal regions, and pelvic cul-de-sac. COMPARISON:  CT scan of April 30, 2018. FINDINGS: Uterus Measurements: 2.1 x 1.8 x 1.1 cm = volume: 2 mL. No fibroids or other mass visualized. Endometrium Thickness: 2 mm which is within normal limits. No focal abnormality visualized. Right ovary Not visualized. Left ovary Not visualized. Other findings:  No abnormal free fluid. IMPRESSION: Ovaries are not visualized. No definite adnexal mass is noted. Uterine atrophy is noted consistent with patient's age. No definite abnormality seen in the pelvis. Electronically Signed   By: Marijo Conception, M.D.   On: 05/01/2018 11:59   Ct Abdomen Pelvis W Contrast  Result Date: 04/30/2018 CLINICAL DATA:  Abdominal pain since last week. Gastroenteritis or colitis is suspected. EXAM: CT ABDOMEN AND PELVIS WITH CONTRAST TECHNIQUE: Multidetector CT imaging of the abdomen and pelvis was performed using the standard protocol following bolus administration of intravenous contrast. CONTRAST:  27mL OMNIPAQUE IOHEXOL 300 MG/ML  SOLN COMPARISON:  Chest CT 12/11/2006 FINDINGS: Lower chest: Redemonstration of marked cardiomegaly with biatrial enlargement. No pericardial effusion or thickening. Aneurysmal dilatation of the included ascending thoracic aorta to 4.3 cm, slightly increased from 4 cm previously. No dissection is noted. Contrast is seen within the included distal esophagus possibly from reflux or delayed emptying. Median sternotomy sutures are in place. Small to moderate right effusion is noted. Subpleural areas of fibrosis are identified at each base with mild peribronchial thickening to the right lower lobe. Hepatobiliary: New  since 2008 are ill-defined hypodense masses that do not appear to represent simple cysts within the liver. Numerous gallstones are identified within the  gallbladder without mural thickening. No biliary dilatation is identified. Pancreas: The distal body and tail of the pancreas are atrophic. Cystic masses of the body and tail of the pancreas are identified the largest in the body measuring 2 x 3.4 x 1.9 cm, series 2/34 and coronal image 33. A smaller cystic lesion of the tail of the pancreas is identified measuring 1.3 x 0.7 x 1 cm. These could represent pseudocysts versus cystic neoplasms of the pancreas among some possibilities. Spleen: Normal size spleen. Adrenals/Urinary Tract: Normal bilateral adrenal glands. Mild cortical thinning of both kidneys. Symmetric pyelograms on repeat delayed imaging through the kidneys. No hydroureteronephrosis. The urinary bladder is unremarkable for the degree of distention. Stomach/Bowel: The stomach is distended with oral contrast. Normal small bowel rotation is identified. Mid to distal small bowel loops are distended with fluid possibly secondary what appears to be an early or partial large bowel obstruction and potentially an incompetent ileocecal valve. Dilatation of the cecum to 6.8 cm is identified and there is diffuse fluid-filled distention of large bowel noted to a transition point at the splenic flexure. Short segmental annular constricting abnormality with mural thickening is suggested, series 2/35 through 41 and series 7/12. Colonic neoplasm or short segmental focal colitis contributing to large bowel dilatation is raised. Sigmoid diverticulosis without acute diverticulitis is identified. Vascular/Lymphatic: Moderate aortoiliac atherosclerosis with ectasia of the abdominal aorta. Patent branch vessels. Patent splenic and portal veins. No abdominopelvic adenopathy. No inguinal or pelvic sidewall adenopathy. Reproductive: Unremarkable Other: Mild soft tissue anasarca.  Small amount ascites about the liver. Musculoskeletal: Advanced osteoarthritic change of both hips bone-on-bone apposition. Thoracolumbar spondylosis. No aggressive osseous lesions are identified. IMPRESSION: 1. Cystic masses of the body and tail of the pancreas measuring 2 x 3.4 x 1.9 cm and 1.3 x 0.7 x 1 cm. These could represent pseudocysts versus cystic neoplasms of the pancreas among some possibilities. Pancreatic MRI may help for further assessment. 2. Short segmental annular constricting abnormality of the colon at the splenic flexure suspicious for colonic neoplasm or short segmental colitis contributing to large bowel dilatation. Ill-defined hypodense lesions of the liver are new since prior chest CT from 2008. Findings are concerning for hepatic metastasis given their non simple cystic appearance. Mid to distal small bowel loops are distended with fluid may be secondary to incompetence of the ileocecal valve and from the early partial or large bowel obstruction. 3. Uncomplicated cholelithiasis. 4. Aortoiliac atherosclerosis with ectasia of the abdominal aorta. 5. Mild soft tissue anasarca. 6. Advanced osteoarthritic change of both hips. 7. Aneurysmal dilatation of the ascending thoracic aorta to 4.3 cm. Recommend annual imaging followup by CTA or MRA. This recommendation follows 2010 ACCF/AHA/AATS/ACR/ASA/SCA/SCAI/SIR/STS/SVM Guidelines for the Diagnosis and Management of Patients with Thoracic Aortic Disease. 2010; 121: V253-G644. 8. Stable marked cardiomegaly. These results were called by telephone at the time of interpretation on 04/30/2018 at 8:33 pm to Dr. Arta Silence , who verbally acknowledged these results. Electronically Signed   By: Ashley Royalty M.D.   On: 04/30/2018 20:33   Dg Abd Portable 2v  Result Date: 05/02/2018 CLINICAL DATA:  Small-bowel obstruction.  Abdominal pain. EXAM: PORTABLE ABDOMEN - 2 VIEW COMPARISON:  KUB 05/01/2018.  CT abdomen 04/30/2018. FINDINGS: Distended loops of  colon and small bowel again noted. Again colonic obstruction could present this fashion. Reference is made to prior CT report of 04/30/2018. Previously administered oral contrast remains in the colon. IV contrast noted in the bladder. No free air. Degenerative changes scoliosis lumbar spine. Degenerative  changes both hips. Diffuse osteopenia. Aortoiliac and peripheral vascular calcification. Prior median sternotomy. Bibasilar atelectasis/infiltrates. IMPRESSION: 1. Persistent distended loops of colon and small bowel again noted. Again colonic obstruction could present in this fashion. Reference is made to prior CT report of 04/30/2018. No significant change noted. 2.  Bibasilar atelectasis/infiltrates. Electronically Signed   By: Marcello Moores  Register   On: 05/02/2018 07:36   Dg Abd Portable 2v  Result Date: 05/01/2018 CLINICAL DATA:  Follow-up small bowel obstruction EXAM: PORTABLE ABDOMEN - 2 VIEW COMPARISON:  CT from yesterday FINDINGS: Continued small and large bowel dilatation related to a left colonic transition point by prior CT. Cardiomegaly and small to moderate right pleural effusion. IMPRESSION: 1. Unchanged small and large bowel dilatation related to a left colonic transitioned by prior CT. 2. Cardiomegaly and small to moderate right pleural effusion. Electronically Signed   By: Monte Fantasia M.D.   On: 05/01/2018 07:34    Colonic mass #85 year old female patient history of mechanical heart valve/A. Fib-on Coumadin; currently admitted to hospital for abdominal bloating/distention-with CT scan meeting concerning for splenic flexure malignancy/colonic obstruction  #Splenic flexure colonic mass-with hypodense lesions in the liver suspicious for malignancy-with evidence of bowel obstruction.  No colonoscopy.  #Cystic masses in the pancreas body and tail-pseudocyst versus complex cyst  #Elderly frail/mechanical valve-Coumadin/supratherapeutic INR.  Recommendations: I had a long discussion with the  patient's granddaughter in the room regarding our significant concerns for colonic malignancy with colonic obstruction based on imaging.  Given her age comorbidities-as per surgery /GI she would be at high risk for any procedural/surgical complications.  Patient/family not interested in aggressive interventions-which I think is very reasonable.  Patient is DNR/DNI.  # I would recommend evaluation with palliative care-symptom control/disposition planning.  Given high-grade obstruction/without intervention-life expectancy the order of few weeks-which might qualify her for hospice home services.  Discussed with Josh Borders; palliative care provider.  # I reviewed the blood work- with the patient in detail; also reviewed the imaging independently [as summarized above]; and with the patient in detail.   Thank you Dr.Ojie for allowing me to participate in the care of your pleasant patient. Please do not hesitate to contact me with questions or concerns in the interim.  All questions were answered. The patient's family knows to call the clinic with any problems, questions or concerns.   Cammie Sickle, MD 05/02/2018 12:25 PM

## 2018-05-02 NOTE — Progress Notes (Signed)
New hospice home referral received from [facility] following a Palliative Medicine consult. Patient is a 85 year old woman with a known history of arthritis, A fib, CHF, valvular disease s/p St Jude valve replacement, HTN and HLD admitted to [facility] from home on 1/6 with complaints of abdominal pain and poor appetite. Work up in the ED showed a partial bowel obstruction and an area concerning for a possible malignant mass and hepatic lesions. Palliative medicine and oncology were consulted and have spoken with patient and her granddaughter Kathy Mccall. They have chosen to focus on comfort with the support of hospice services at the hospice home. Writer met in the room with patient, Kathy Mccall and patient's daughter Kathy Mccall to initiate education regarding hospice services, philosophy and team approach to care with understanding voiced. Questions answered, consents signed, Kathy Mccall is HCPOA. Patient complained of abdominal pain, per Jody she had just received IV morphine. Patient repositioned to her side and reported being more comfortable. She was alert through out the discussion and family has been open about discharge plan with her. Plan is for  Discharge to the hospice home tomorrow 1/10. Hospital care team updated. Signed DNR in place in hospital chart.Pateint information faxed to referral. Kathy Shanks RN, BSN, [facility] liaison [phone]

## 2018-05-02 NOTE — Assessment & Plan Note (Addendum)
#  85 year old female patient history of mechanical heart valve/A. Fib-on Coumadin; currently admitted to hospital for abdominal bloating/distention-with CT scan meeting concerning for splenic flexure malignancy/colonic obstruction  #Splenic flexure colonic mass-with hypodense lesions in the liver suspicious for malignancy-with evidence of bowel obstruction.  No colonoscopy/biopsy.  #Cystic masses in the pancreas body and tail-pseudocyst versus complex cyst  #Elderly frail/mechanical valve-Coumadin/supratherapeutic INR.  Recommendations: Given patient's advanced age frail condition poor candidate for any surgical intervention.  Patient is currently obstructed with a colonic mass-with abdominal distention/pain.  Given absence of definitive treatment intervention-agree with hospice recommendations.  Patient and family on board with hospice.  Awaiting hospice home discharge today.  Discussed with Dr. Bridgett Larsson.  Also discussed with palliative care provider Josh Borders.  Expected life expectancy in the order of less than 3 weeks.

## 2018-05-02 NOTE — Progress Notes (Signed)
Brief Progress Note Patient reassessed at bedside with Dr Dahlia Byes, MD with granddaughter present. Still refusing surgery and requesting palliative. General surgery will sign off. Please re-consult with questions or new issues.   Kathy Simon, PA-C [facility] 05/02/2018, 10:00 AM [phone] M-F: 7am - 4pm

## 2018-05-02 NOTE — Progress Notes (Signed)
Dr. Marcille Blanco informed about patients Pt = 48.3 and INR =5.38. No new orders received at this time.

## 2018-05-02 NOTE — Clinical Social Work Note (Signed)
Clinical Social Work Assessment  Patient Details  Name: Kathy Mccall MRN: [id number] Date of Birth: 09-Mar-1924  Date of referral:  05/02/18               Reason for consult:  End of Life/Hospice                Permission sought to share information with:  Facility Sport and exercise psychologist, Family Supports Permission granted to share information::  Yes, Verbal Permission Granted  Name::        Agency::     Relationship::     Contact Information:     Housing/Transportation Living arrangements for the past 2 months:  Single Family Home Source of Information:  Patient, Adult Children Patient Interpreter Needed:  None Criminal Activity/Legal Involvement Pertinent to Current Situation/Hospitalization:  No - Comment as needed Significant Relationships:  Adult Children Lives with:    Do you feel safe going back to the place where you live?  (n/a) Need for family participation in patient care:  Yes (Comment)  Care giving concerns:  Patient resides with family at baseline.   Social Worker assessment / plan:  CSW informed by Palliative Care that patient and family have decided upon hospice home. CSW spoke with Corliss Marcus: [phone], patient's granddaughter, regarding providing hospice home choice. Jody chose [facility] and patient agreed. Patient did not say much but was appropriate in her conversation. Referral given to [facility] for hospice home and [facility] will speak with patient and family and stated that there will be a bed available for her tomorrow.   Employment status:    Insurance information:    PT Recommendations:    Information / Referral to community resources:     Patient/Family's Response to care:  Patient's granddaughter expressed appreciation for CSW assistance.  Patient/Family's Understanding of and Emotional Response to Diagnosis, Current Treatment, and Prognosis:  Patient appeared to be in a pleasant and bright mood for the circumstances.   Emotional  Assessment Appearance:  Appears younger than stated age Attitude/Demeanor/Rapport:  (pleasant and cooperative) Affect (typically observed):  Accepting, Calm Orientation:  Oriented to Self, Oriented to Place, Oriented to Situation Alcohol / Substance use:  Not Applicable Psych involvement (Current and /or in the community):  No (Comment)  Discharge Needs  Concerns to be addressed:  Care Coordination Readmission within the last 30 days:  No Current discharge risk:  None Barriers to Discharge:  No Barriers Identified   Shela Leff, [place] 05/02/2018, 12:17 PM

## 2018-05-02 NOTE — Consult Note (Signed)
[place]  [phone] [phone]   Name: NANCE MCCOMBS Date: 05/02/2018 MRN: [id number]  DOB: 06-14-1923  Patient Care Team: Jerrol Banana., MD as PCP - General (Unknown Physician Specialty) Minna Merritts, MD as Consulting Physician (Cardiology) Lorelee Cover., MD as Consulting Physician (Ophthalmology)    REASON FOR CONSULTATION: Palliative Care consult requested for this 85 y.o. female with multiple medical problems including atrial fibrillation on warfarin, history of CHF, history of valvular disease status post aortic valve replacement, hypertension, hyperlipidemia, and OA, who presented to the hospital on 05/01/2018 with abdominal pain following several weeks poor oral intake and intermittent nausea.  Abdominal CT revealed a nearly obstructing colonic mass and masses in the pancreas and liver.  She was evaluated by surgery but felt not to be a surgical candidate.  She is also been seen by oncology.  Family are opting not to pursue work-up or treatment.  Palliative care was consulted to further discuss goals.   SOCIAL HISTORY:    Patient is widowed.  She lives at home independently.  She has a daughter and granddaughter who are both involved in her care.  She had another daughter who died of colon cancer.  ADVANCE DIRECTIVES:  Patient has a living will.  Her granddaughter is her healthcare power of attorney.  CODE STATUS: DNR  PAST MEDICAL HISTORY: Past Medical History:  Diagnosis Date  . Arthritis   . Atrial fibrillation ([place])   . Congestive heart failure ([place])   . Hyperlipidemia   . Hypertension   . Rheumatic fever   . S/P aortic valve replacement    [facility]    PAST SURGICAL HISTORY:  Past Surgical History:  Procedure Laterality Date  . AORTIC VALVE REPLACEMENT    . UPPER GI ENDOSCOPY     01/09/08 normal esophagus, normal duodenum, non-bleeding erosive gastropathy     HEMATOLOGY/ONCOLOGY HISTORY:   No history exists.    ALLERGIES:  is allergic to sulfa antibiotics and sulfonamide derivatives.  MEDICATIONS:  Current Facility-Administered Medications  Medication Dose Route Frequency Provider Last Rate Last Dose  . 0.9 %  sodium chloride infusion   Intravenous Continuous Ojie, Jude, MD 75 mL/hr at 05/02/18 7342    . acetaminophen (TYLENOL) tablet 650 mg  650 mg Oral Q6H PRN Lance Coon, MD   650 mg at 05/01/18 2133   Or  . acetaminophen (TYLENOL) suppository 650 mg  650 mg Rectal Q6H PRN Lance Coon, MD      . latanoprost (XALATAN) 0.005 % ophthalmic solution 1 drop  1 drop Both Eyes QHS Lance Coon, MD   1 drop at 05/01/18 2125  . levothyroxine (SYNTHROID, LEVOTHROID) tablet 50 mcg  50 mcg Oral QAC breakfast Lance Coon, MD   50 mcg at 05/02/18 0541  . morphine 2 MG/ML injection 2 mg  2 mg Intravenous Q4H PRN Lance Coon, MD   2 mg at 05/02/18 1000  . ondansetron (ZOFRAN) tablet 4 mg  4 mg Oral Q6H PRN Lance Coon, MD       Or  . ondansetron [facility]) injection 4 mg  4 mg Intravenous Q6H PRN Lance Coon, MD   4 mg at 05/01/18 2133  . pantoprazole (PROTONIX) EC tablet 20 mg  20 mg Oral Daily Lance Coon, MD   20 mg at 05/02/18 0959  . PARoxetine (PAXIL) tablet 10 mg  10 mg Oral Daily Lance Coon, MD   10 mg at 05/02/18 0959  .  timolol (TIMOPTIC) 0.5 % ophthalmic solution 1 drop  1 drop Both Eyes Daily Lance Coon, MD   1 drop at 05/02/18 1000    VITAL SIGNS: BP 100/60 (BP Location: Left Arm)   Pulse 68   Temp 98.7 F (37.1 C) (Oral)   Resp 20   Ht '5\' 2"'$  (1.575 m)   Wt 90 lb 9.7 oz (41.1 kg)   SpO2 93%   BMI 16.57 kg/m  Filed Weights   04/30/18 1405 05/01/18 0100  Weight: 89 lb (40.4 kg) 90 lb 9.7 oz (41.1 kg)    Estimated body mass index is 16.57 kg/m as calculated from the following:   Height as of this encounter: '5\' 2"'$  (1.575 m).   Weight as of this encounter: 90 lb 9.7 oz (41.1 kg).  LABS: CBC:    Component  Value Date/Time   WBC 9.2 05/02/2018 0446   HGB 9.7 (L) 05/02/2018 0446   HGB 13.5 04/25/2018 1216   HCT 29.3 (L) 05/02/2018 0446   HCT 38.9 04/25/2018 1216   PLT 195 05/02/2018 0446   PLT 233 04/25/2018 1216   MCV 104.3 (H) 05/02/2018 0446   MCV 101 (H) 04/25/2018 1216   NEUTROABS 5.8 04/25/2018 1216   LYMPHSABS 0.8 04/25/2018 1216   EOSABS 0.0 04/25/2018 1216   BASOSABS 0.0 04/25/2018 1216   Comprehensive Metabolic Panel:    Component Value Date/Time   NA 131 (L) 05/02/2018 0446   NA 134 04/25/2018 1216   K 3.8 05/02/2018 0446   CL 102 05/02/2018 0446   CO2 17 (L) 05/02/2018 0446   BUN 29 (H) 05/02/2018 0446   BUN 14 04/25/2018 1216   CREATININE 1.03 (H) 05/02/2018 0446   GLUCOSE 78 05/02/2018 0446   CALCIUM 7.8 (L) 05/02/2018 0446   AST 27 05/02/2018 0446   ALT 16 05/02/2018 0446   ALKPHOS 73 05/02/2018 0446   BILITOT 2.4 (H) 05/02/2018 0446   BILITOT 1.4 (H) 04/25/2018 1216   PROT 5.4 (L) 05/02/2018 0446   PROT 7.3 04/25/2018 1216   ALBUMIN 2.6 (L) 05/02/2018 0446   ALBUMIN 3.7 04/25/2018 1216    RADIOGRAPHIC STUDIES: Dg Abd 1 View  Result Date: 04/29/2018 CLINICAL DATA:  Lower abdominal pain, constipation, and unintended weight loss. EXAM: ABDOMEN - 1 VIEW COMPARISON:  None. FINDINGS: Gaseous colonic distention, most prominent in the cecum to 13 cm. Air-filled normal caliber small bowel in the left abdomen without obstruction. No evidence of free air. No significant formed stool in the colon. No definite radiopaque calculi, costochondral cartilage calcifications in the upper abdomen partially obscure evaluation. Scoliotic curvature and degenerative change of the spine. Advanced degenerative change of the left hip. IMPRESSION: 1. Gaseous colonic distension, greatest in the right lower quadrant likely involving the cecum. No evidence of bowel obstruction. Consider CT for further evaluation. 2. No evidence of bowel obstruction or free air. Electronically Signed   By:  Keith Rake M.D.   On: 04/29/2018 21:09   US Pelvis (transabdominal Only)  Result Date: 05/01/2018 CLINICAL DATA:  Lower abdominal pain, weight loss. EXAM: TRANSABDOMINAL ULTRASOUND OF PELVIS TECHNIQUE: Transabdominal ultrasound examination of the pelvis was performed including evaluation of the uterus, ovaries, adnexal regions, and pelvic cul-de-sac. COMPARISON:  CT scan of April 30, 2018. FINDINGS: Uterus Measurements: 2.1 x 1.8 x 1.1 cm = volume: 2 mL. No fibroids or other mass visualized. Endometrium Thickness: 2 mm which is within normal limits. No focal abnormality visualized. Right ovary Not visualized. Left ovary Not visualized. Other findings:  No abnormal free fluid. IMPRESSION: Ovaries are not visualized. No definite adnexal mass is noted. Uterine atrophy is noted consistent with patient's age. No definite abnormality seen in the pelvis. Electronically Signed   By: Marijo Conception, M.D.   On: 05/01/2018 11:59   Ct Abdomen Pelvis W Contrast  Result Date: 04/30/2018 CLINICAL DATA:  Abdominal pain since last week. Gastroenteritis or colitis is suspected. EXAM: CT ABDOMEN AND PELVIS WITH CONTRAST TECHNIQUE: Multidetector CT imaging of the abdomen and pelvis was performed using the standard protocol following bolus administration of intravenous contrast. CONTRAST:  60m OMNIPAQUE IOHEXOL 300 MG/ML  SOLN COMPARISON:  Chest CT 12/11/2006 FINDINGS: Lower chest: Redemonstration of marked cardiomegaly with biatrial enlargement. No pericardial effusion or thickening. Aneurysmal dilatation of the included ascending thoracic aorta to 4.3 cm, slightly increased from 4 cm previously. No dissection is noted. Contrast is seen within the included distal esophagus possibly from reflux or delayed emptying. Median sternotomy sutures are in place. Small to moderate right effusion is noted. Subpleural areas of fibrosis are identified at each base with mild peribronchial thickening to the right lower lobe.  Hepatobiliary: New since 2008 are ill-defined hypodense masses that do not appear to represent simple cysts within the liver. Numerous gallstones are identified within the gallbladder without mural thickening. No biliary dilatation is identified. Pancreas: The distal body and tail of the pancreas are atrophic. Cystic masses of the body and tail of the pancreas are identified the largest in the body measuring 2 x 3.4 x 1.9 cm, series 2/34 and coronal image 33. A smaller cystic lesion of the tail of the pancreas is identified measuring 1.3 x 0.7 x 1 cm. These could represent pseudocysts versus cystic neoplasms of the pancreas among some possibilities. Spleen: Normal size spleen. Adrenals/Urinary Tract: Normal bilateral adrenal glands. Mild cortical thinning of both kidneys. Symmetric pyelograms on repeat delayed imaging through the kidneys. No hydroureteronephrosis. The urinary bladder is unremarkable for the degree of distention. Stomach/Bowel: The stomach is distended with oral contrast. Normal small bowel rotation is identified. Mid to distal small bowel loops are distended with fluid possibly secondary what appears to be an early or partial large bowel obstruction and potentially an incompetent ileocecal valve. Dilatation of the cecum to 6.8 cm is identified and there is diffuse fluid-filled distention of large bowel noted to a transition point at the splenic flexure. Short segmental annular constricting abnormality with mural thickening is suggested, series 2/35 through 41 and series 7/12. Colonic neoplasm or short segmental focal colitis contributing to large bowel dilatation is raised. Sigmoid diverticulosis without acute diverticulitis is identified. Vascular/Lymphatic: Moderate aortoiliac atherosclerosis with ectasia of the abdominal aorta. Patent branch vessels. Patent splenic and portal veins. No abdominopelvic adenopathy. No inguinal or pelvic sidewall adenopathy. Reproductive: Unremarkable Other: Mild  soft tissue anasarca. Small amount ascites about the liver. Musculoskeletal: Advanced osteoarthritic change of both hips bone-on-bone apposition. Thoracolumbar spondylosis. No aggressive osseous lesions are identified. IMPRESSION: 1. Cystic masses of the body and tail of the pancreas measuring 2 x 3.4 x 1.9 cm and 1.3 x 0.7 x 1 cm. These could represent pseudocysts versus cystic neoplasms of the pancreas among some possibilities. Pancreatic MRI may help for further assessment. 2. Short segmental annular constricting abnormality of the colon at the splenic flexure suspicious for colonic neoplasm or short segmental colitis contributing to large bowel dilatation. Ill-defined hypodense lesions of the liver are new since prior chest CT from 2008. Findings are concerning for hepatic metastasis given their non simple cystic  appearance. Mid to distal small bowel loops are distended with fluid may be secondary to incompetence of the ileocecal valve and from the early partial or large bowel obstruction. 3. Uncomplicated cholelithiasis. 4. Aortoiliac atherosclerosis with ectasia of the abdominal aorta. 5. Mild soft tissue anasarca. 6. Advanced osteoarthritic change of both hips. 7. Aneurysmal dilatation of the ascending thoracic aorta to 4.3 cm. Recommend annual imaging followup by CTA or MRA. This recommendation follows 2010 ACCF/AHA/AATS/ACR/ASA/SCA/SCAI/SIR/STS/SVM Guidelines for the Diagnosis and Management of Patients with Thoracic Aortic Disease. 2010; 121: Y073-X106. 8. Stable marked cardiomegaly. These results were called by telephone at the time of interpretation on 04/30/2018 at 8:33 pm to Dr. Arta Silence , who verbally acknowledged these results. Electronically Signed   By: Ashley Royalty M.D.   On: 04/30/2018 20:33   Dg Abd Portable 2v  Result Date: 05/02/2018 CLINICAL DATA:  Small-bowel obstruction.  Abdominal pain. EXAM: PORTABLE ABDOMEN - 2 VIEW COMPARISON:  KUB 05/01/2018.  CT abdomen 04/30/2018. FINDINGS:  Distended loops of colon and small bowel again noted. Again colonic obstruction could present this fashion. Reference is made to prior CT report of 04/30/2018. Previously administered oral contrast remains in the colon. IV contrast noted in the bladder. No free air. Degenerative changes scoliosis lumbar spine. Degenerative changes both hips. Diffuse osteopenia. Aortoiliac and peripheral vascular calcification. Prior median sternotomy. Bibasilar atelectasis/infiltrates. IMPRESSION: 1. Persistent distended loops of colon and small bowel again noted. Again colonic obstruction could present in this fashion. Reference is made to prior CT report of 04/30/2018. No significant change noted. 2.  Bibasilar atelectasis/infiltrates. Electronically Signed   By: Marcello Moores  Register   On: 05/02/2018 07:36   Dg Abd Portable 2v  Result Date: 05/01/2018 CLINICAL DATA:  Follow-up small bowel obstruction EXAM: PORTABLE ABDOMEN - 2 VIEW COMPARISON:  CT from yesterday FINDINGS: Continued small and large bowel dilatation related to a left colonic transition point by prior CT. Cardiomegaly and small to moderate right pleural effusion. IMPRESSION: 1. Unchanged small and large bowel dilatation related to a left colonic transitioned by prior CT. 2. Cardiomegaly and small to moderate right pleural effusion. Electronically Signed   By: Monte Fantasia M.D.   On: 05/01/2018 07:34    PERFORMANCE STATUS (ECOG) : 4 - Bedbound  Review of Systems As noted above. Otherwise, a complete review of systems is negative.  Physical Exam General: frail appearing, extremely thin with Bilat temporal wasting Cardiovascular: regular rate and rhythm Pulmonary: clear ant fields Abdomen: distended, hypoactive bowel sounds, tender to touch Extremities: no edema Skin: no rashes Neurological: wakes when stimulated, answers simple questions, hard of hearing  IMPRESSION: I met with patient's daughter and granddaughter, both of whom are at bedside.   Granddaughter, Ezzard Flax, is her healthcare power of attorney.  Family say they recognize that patient is rapidly approaching end-of-life.  They confirmed a desire not to pursue further work-up or treatment.  They just want to keep her comfortable and manage her symptoms at end-of-life.  Family ask about transfer to [facility].  Patient would be clinically appropriate for residential hospice.  Will consult social work to help coordinate discharge.  Family state that patient would not want to be resuscitated or have her life prolonged artificially on machines.  They say she has wanted to be a DNR for many years.  DNR order entered into the computer and form signed and placed in chart.  PLAN: DNR Comfort Care Liberalize prn opioids and antiemetics Transfer to [place] when bed is available  SW consult to help coordinate discharge  Time Total: 45 minutes  Visit consisted of counseling and education dealing with the complex and emotionally intense issues of symptom management and palliative care in the setting of serious and potentially life-threatening illness.Greater than 50%  of this time was spent counseling and coordinating care related to the above assessment and plan.  Signed by: Altha Harm, PhD, NP-C [phone] (Work Cell)

## 2018-05-02 NOTE — Progress Notes (Signed)
Initial Nutrition Assessment  DOCUMENTATION CODES:   Severe malnutrition in context of chronic illness  INTERVENTION:   RD will monitor for GOC  NUTRITION DIAGNOSIS:   Severe Malnutrition related to chronic illness(CHF, advanced age ) as evidenced by severe fat depletion, severe muscle depletion.  GOAL:   Patient will meet greater than or equal to 90% of their needs  MONITOR:   Diet advancement, Labs, Weight trends, Skin, I & O's  REASON FOR ASSESSMENT:   Malnutrition Screening Tool    ASSESSMENT:   85 y.o. female with hyponatremia and supratherapeutic INR in the setting of colonic mass and possible hepatic lesions via radiographic imaging, complicated by pertinent comorbidities including aortic stenosis s/p aortic valve replacement, atrial fibrillation on coumadin, CHF, HTN, HLD, and advanced age.  Pt NPO since admit; family does not wish to pursue surgical interventions. Palliative care following. Family leaning towards comfort/hopice. RD will monitor for GOC.   Medications reviewed and include: Synthroid, protonix, paxil, NaCl @75ml /hr  Labs reviewed: Na 131(L), K 3.8 wnl, P 3.8 wnl, Mg 1.8 wnl Hgb 9.7(L), Hct 29.3(L), MCV 104.2(H), MCH 34.5(H)  Pt with severe muscle and fat depletions   Diet Order:   Diet Order            Diet NPO time specified Except for: Sips with Meds  Diet effective now             EDUCATION NEEDS:   No education needs have been identified at this time  Skin:  Skin Assessment: Reviewed RN Assessment  Last BM:  1/6  Height:   Ht Readings from Last 1 Encounters:  05/01/18 5\' 2"  (1.575 m)    Weight:   Wt Readings from Last 1 Encounters:  05/01/18 41.1 kg    Ideal Body Weight:  50 kg  BMI:  Body mass index is 16.57 kg/m.  Estimated Nutritional Needs:   Kcal:  1000-1200kcal/day   Protein:  62-70g/day   Fluid:  1L/day   Koleen Distance MS, RD, LDN Pager #- [phone] Office#- [phone] After Hours Pager:  [phone]

## 2018-05-02 NOTE — Progress Notes (Signed)
[place] [facility] [place] NAME: Kathy Mccall    MR#:  [id number]  DATE OF BIRTH:  1923-10-16  SUBJECTIVE:  CHIEF COMPLAINT:   Chief Complaint  Patient presents with  . Abdominal Pain   Patient admitted with complaints of abdominal pains.  Improving but still has some abdominal pains.  No further nausea or vomiting reported .   Patient and family have decided against any aggressive management.  No plans for surgery.  Seen by palliative care with plans for discharge to hospice home tomorrow.  REVIEW OF SYSTEMS:  Review of Systems  Constitutional: Negative for chills and fever.       Weight loss  HENT: Negative for congestion, ear discharge and hearing loss.   Eyes: Negative for blurred vision and double vision.  Respiratory: Negative for cough and hemoptysis.   Cardiovascular: Negative for chest pain and palpitations.  Gastrointestinal: Positive for abdominal pain. Negative for nausea and vomiting.  Genitourinary: Negative for dysuria and urgency.  Musculoskeletal: Negative for myalgias and neck pain.  Skin: Negative for itching and rash.  Neurological: Negative for dizziness and headaches.  Psychiatric/Behavioral: Negative for depression and hallucinations.      DRUG ALLERGIES:   Allergies  Allergen Reactions  . Sulfa Antibiotics   . Sulfonamide Derivatives    VITALS:  Blood pressure 100/60, pulse 68, temperature 98.7 F (37.1 C), temperature source Oral, resp. rate 20, height 5\' 2"  (1.575 m), weight 41.1 kg, SpO2 93 %. PHYSICAL EXAMINATION:  Physical Exam  Constitutional: She is oriented to person, place, and time. She appears well-developed and well-nourished.  HENT:  Head: Normocephalic and atraumatic.  Eyes: Pupils are equal, round, and reactive to light. Conjunctivae and EOM are normal.  Neck: Normal range of motion. Neck supple.  Cardiovascular: Normal rate, regular rhythm and normal heart sounds.  Respiratory: Effort  normal and breath sounds normal.  GI: Soft. She exhibits no distension. There is abdominal tenderness.  Musculoskeletal: Normal range of motion.        General: No edema.  Neurological: She is alert and oriented to person, place, and time.  Skin: Skin is warm.  Psychiatric: She has a normal mood and affect.      LABORATORY PANEL:  Female CBC Recent Labs  Lab 05/02/18 0446  WBC 9.2  HGB 9.7*  HCT 29.3*  PLT 195   ------------------------------------------------------------------------------------------------------------------ Chemistries  Recent Labs  Lab 05/02/18 0446  NA 131*  K 3.8  CL 102  CO2 17*  GLUCOSE 78  BUN 29*  CREATININE 1.03*  CALCIUM 7.8*  MG 1.8  AST 27  ALT 16  ALKPHOS 73  BILITOT 2.4*   RADIOLOGY:  US Pelvis (transabdominal Only)  Result Date: 05/01/2018 CLINICAL DATA:  Lower abdominal pain, weight loss. EXAM: TRANSABDOMINAL ULTRASOUND OF PELVIS TECHNIQUE: Transabdominal ultrasound examination of the pelvis was performed including evaluation of the uterus, ovaries, adnexal regions, and pelvic cul-de-sac. COMPARISON:  CT scan of April 30, 2018. FINDINGS: Uterus Measurements: 2.1 x 1.8 x 1.1 cm = volume: 2 mL. No fibroids or other mass visualized. Endometrium Thickness: 2 mm which is within normal limits. No focal abnormality visualized. Right ovary Not visualized. Left ovary Not visualized. Other findings:  No abnormal free fluid. IMPRESSION: Ovaries are not visualized. No definite adnexal mass is noted. Uterine atrophy is noted consistent with patient's age. No definite abnormality seen in the pelvis. Electronically Signed   By: Marijo Conception, M.D.   On: 05/01/2018 11:59  Dg Abd Portable 2v  Result Date: 05/02/2018 CLINICAL DATA:  Small-bowel obstruction.  Abdominal pain. EXAM: PORTABLE ABDOMEN - 2 VIEW COMPARISON:  KUB 05/01/2018.  CT abdomen 04/30/2018. FINDINGS: Distended loops of colon and small bowel again noted. Again colonic obstruction could  present this fashion. Reference is made to prior CT report of 04/30/2018. Previously administered oral contrast remains in the colon. IV contrast noted in the bladder. No free air. Degenerative changes scoliosis lumbar spine. Degenerative changes both hips. Diffuse osteopenia. Aortoiliac and peripheral vascular calcification. Prior median sternotomy. Bibasilar atelectasis/infiltrates. IMPRESSION: 1. Persistent distended loops of colon and small bowel again noted. Again colonic obstruction could present in this fashion. Reference is made to prior CT report of 04/30/2018. No significant change noted. 2.  Bibasilar atelectasis/infiltrates. Electronically Signed   By: Marcello Moores  Register   On: 05/02/2018 07:36   ASSESSMENT AND PLAN:    1. Bowel obstruction (HCC) -partial obstruction seen on CT imaging, suspicion for colonic neoplasm. Abdominal pains appear improved this morning but still has some pains. Patient evaluated by surgeon .  Patient has near obstructing splenic lesion of the colon with mets to the liver concerning for malignancy.  No tissue diagnosis yet.  Patient also has evidence of cystic mass in the body and tail of pancreas which could represent pseudocyst versus cystic neoplasm of the pancreas.  Discussed MRI for further evaluation with granddaughter who is healthcare power of attorney.  They do not wish to be aggressive at this time given patient's advanced age and comorbidities.  Patient does have significant weight loss and severely malnourished surgeon discussed extensively with patient's granddaughter who is also healthcare power of attorney.  Including risks and benefits of surgery due to patient's advanced age.  They do not want any major intervention at this time . Patient already seen by palliative care team today.  Arrangements are being made to have patient transferred to hospice in a.m.  Please follow-up with social worker regarding discharge planning for tomorrow in a.m.  2. CAD,  stable -continue home medications   Heart valve replaced -aortic valve replacement,  Coumadin currently on hold due to supratherapeutic INR of 5.38.   Follow-up with INR in a.m. with plans to resume Coumadin once INR below 3.5   3. Essential (primary) hypertension -continue home medications    4.  Hypothyroidism -continue home dose thyroid replacement TSH level normal  5.  Hyponatremia  Likely due to dehydration.  Sodium level improved to 131 with gentle IV fluid hydration.  DVT prophylaxis; Coumadin currently on hold due to supratherapeutic INR  All the records are reviewed and case discussed with Care Management/Social Worker. Management plans discussed with the patient, family and they are in agreement.  CODE STATUS: DNR  TOTAL TIME TAKING CARE OF THIS PATIENT: 36 minutes.   More than 50% of the time was spent in counseling/coordination of care: YES  POSSIBLE D/C IN 2 DAYS, DEPENDING ON CLINICAL CONDITION.   Tiziana Cislo M.D on 05/02/2018 at 11:38 AM  Between 7am to 6pm - Pager - [phone]  After 6pm go to [email or web address] - password [email or web address] [facility]  [facility]  Office  [phone]  CC: Primary care physician; Jerrol Banana., MD  Note: This dictation was prepared with Dragon dictation along with smaller phrase technology. Any transcriptional errors that result from this process are unintentional.

## 2018-05-03 DIAGNOSIS — K56600 Partial intestinal obstruction, unspecified as to cause: Secondary | ICD-10-CM

## 2018-05-03 DIAGNOSIS — R54 Age-related physical debility: Secondary | ICD-10-CM

## 2018-05-03 MED ORDER — ACETAMINOPHEN 325 MG PO TABS: 650.0000 mg | ORAL_TABLET | Freq: Four times a day (QID) | ORAL | Status: AC | PRN

## 2018-05-03 MED ORDER — ONDANSETRON HCL 4 MG PO TABS: 4.0000 mg | ORAL_TABLET | Freq: Four times a day (QID) | ORAL | 0 refills | Status: AC | PRN

## 2018-05-03 MED ORDER — ONDANSETRON HCL 4 MG/2ML IJ SOLN: 4.0000 mg | Freq: Four times a day (QID) | INTRAMUSCULAR | 0 refills | Status: AC | PRN

## 2018-05-03 MED ORDER — ACETAMINOPHEN 650 MG RE SUPP: 650.0000 mg | Freq: Four times a day (QID) | RECTAL | 0 refills | Status: AC | PRN

## 2018-05-03 MED ORDER — MORPHINE SULFATE (PF) 2 MG/ML IV SOLN: 2.0000 mg | INTRAVENOUS | 0 refills | Status: AC | PRN

## 2018-05-03 NOTE — Care Management Important Message (Signed)
Copy of signed Medicare IM left with patient in room. 

## 2018-05-03 NOTE — Clinical Social Work Note (Signed)
Patient discharged as planned to hospice home. Shela Leff MSW,LCSW [phone]

## 2018-05-03 NOTE — Progress Notes (Signed)
[facility]  [phone] [phone]   Name: Kathy Mccall Date: 05/03/2018 MRN: [id number]  DOB: 21-Aug-1923  Patient Care Team: Jerrol Banana., MD as PCP - General (Unknown Physician Specialty) Minna Merritts, MD as Consulting Physician (Cardiology) Lorelee Cover., MD as Consulting Physician (Ophthalmology)    REASON FOR CONSULTATION: Palliative Care consult requested for this 85 y.o. female with multiple medical problems including atrial fibrillation on warfarin, history of CHF, history of valvular disease status post aortic valve replacement, hypertension, hyperlipidemia, and OA, who presented to the hospital on 05/01/2018 with abdominal pain following several weeks poor oral intake and intermittent nausea.  Abdominal CT revealed a nearly obstructing colonic mass and masses in the pancreas and liver.  She was evaluated by surgery but felt not to be a surgical candidate.  She is also been seen by oncology.  Family are opting not to pursue work-up or treatment.  Palliative care was consulted to further discuss goals.  CODE STATUS: DNR  PAST MEDICAL HISTORY: Past Medical History:  Diagnosis Date  . Arthritis   . Atrial fibrillation ([place])   . Congestive heart failure ([place])   . Hyperlipidemia   . Hypertension   . Rheumatic fever   . S/P aortic valve replacement    [facility]    PAST SURGICAL HISTORY:  Past Surgical History:  Procedure Laterality Date  . AORTIC VALVE REPLACEMENT    . UPPER GI ENDOSCOPY     01/09/08 normal esophagus, normal duodenum, non-bleeding erosive gastropathy    HEMATOLOGY/ONCOLOGY HISTORY:   No history exists.    ALLERGIES:  is allergic to sulfa antibiotics and sulfonamide derivatives.  MEDICATIONS:  Current Facility-Administered Medications  Medication Dose Route Frequency Provider Last Rate Last Dose  . acetaminophen (TYLENOL) tablet 650 mg  650 mg Oral Q6H PRN Lance Coon, MD   650 mg at 05/01/18 2133   Or  . acetaminophen (TYLENOL) suppository 650 mg  650 mg Rectal Q6H PRN Lance Coon, MD      . latanoprost (XALATAN) 0.005 % ophthalmic solution 1 drop  1 drop Both Eyes QHS Lance Coon, MD   1 drop at 05/02/18 2020  . levothyroxine (SYNTHROID, LEVOTHROID) tablet 50 mcg  50 mcg Oral QAC breakfast Lance Coon, MD   50 mcg at 05/03/18 0545  . morphine 2 MG/ML injection 2 mg  2 mg Intravenous Q4H PRN Lance Coon, MD   2 mg at 05/03/18 0915  . ondansetron (ZOFRAN) tablet 4 mg  4 mg Oral Q6H PRN Lance Coon, MD       Or  . ondansetron [facility]) injection 4 mg  4 mg Intravenous Q6H PRN Lance Coon, MD   4 mg at 05/01/18 2133  . pantoprazole (PROTONIX) EC tablet 20 mg  20 mg Oral Daily Lance Coon, MD   20 mg at 05/03/18 0916  . PARoxetine (PAXIL) tablet 10 mg  10 mg Oral Daily Lance Coon, MD   10 mg at 05/03/18 0916  . timolol (TIMOPTIC) 0.5 % ophthalmic solution 1 drop  1 drop Both Eyes Daily Lance Coon, MD   1 drop at 05/03/18 0916    VITAL SIGNS: BP (!) 80/48 (BP Location: Right Arm)   Pulse 89   Temp 97.6 F (36.4 C) (Oral)   Resp 16   Ht 5\' 2"  (1.575 m)   Wt 90 lb 9.7 oz (41.1 kg)   SpO2 92%   BMI 16.57 kg/m  Filed  Weights   04/30/18 1405 05/01/18 0100  Weight: 89 lb (40.4 kg) 90 lb 9.7 oz (41.1 kg)    Estimated body mass index is 16.57 kg/m as calculated from the following:   Height as of this encounter: 5\' 2"  (1.575 m).   Weight as of this encounter: 90 lb 9.7 oz (41.1 kg).  LABS: CBC:    Component Value Date/Time   WBC 9.2 05/02/2018 0446   HGB 9.7 (L) 05/02/2018 0446   HGB 13.5 04/25/2018 1216   HCT 29.3 (L) 05/02/2018 0446   HCT 38.9 04/25/2018 1216   PLT 195 05/02/2018 0446   PLT 233 04/25/2018 1216   MCV 104.3 (H) 05/02/2018 0446   MCV 101 (H) 04/25/2018 1216   NEUTROABS 5.8 04/25/2018 1216   LYMPHSABS 0.8 04/25/2018 1216   EOSABS 0.0 04/25/2018 1216   BASOSABS 0.0 04/25/2018 1216   Comprehensive  Metabolic Panel:    Component Value Date/Time   NA 131 (L) 05/02/2018 0446   NA 134 04/25/2018 1216   K 3.8 05/02/2018 0446   CL 102 05/02/2018 0446   CO2 17 (L) 05/02/2018 0446   BUN 29 (H) 05/02/2018 0446   BUN 14 04/25/2018 1216   CREATININE 1.03 (H) 05/02/2018 0446   GLUCOSE 78 05/02/2018 0446   CALCIUM 7.8 (L) 05/02/2018 0446   AST 27 05/02/2018 0446   ALT 16 05/02/2018 0446   ALKPHOS 73 05/02/2018 0446   BILITOT 2.4 (H) 05/02/2018 0446   BILITOT 1.4 (H) 04/25/2018 1216   PROT 5.4 (L) 05/02/2018 0446   PROT 7.3 04/25/2018 1216   ALBUMIN 2.6 (L) 05/02/2018 0446   ALBUMIN 3.7 04/25/2018 1216    RADIOGRAPHIC STUDIES: Dg Abd 1 View  Result Date: 04/29/2018 CLINICAL DATA:  Lower abdominal pain, constipation, and unintended weight loss. EXAM: ABDOMEN - 1 VIEW COMPARISON:  None. FINDINGS: Gaseous colonic distention, most prominent in the cecum to 13 cm. Air-filled normal caliber small bowel in the left abdomen without obstruction. No evidence of free air. No significant formed stool in the colon. No definite radiopaque calculi, costochondral cartilage calcifications in the upper abdomen partially obscure evaluation. Scoliotic curvature and degenerative change of the spine. Advanced degenerative change of the left hip. IMPRESSION: 1. Gaseous colonic distension, greatest in the right lower quadrant likely involving the cecum. No evidence of bowel obstruction. Consider CT for further evaluation. 2. No evidence of bowel obstruction or free air. Electronically Signed   By: Keith Rake M.D.   On: 04/29/2018 21:09   US Pelvis (transabdominal Only)  Result Date: 05/01/2018 CLINICAL DATA:  Lower abdominal pain, weight loss. EXAM: TRANSABDOMINAL ULTRASOUND OF PELVIS TECHNIQUE: Transabdominal ultrasound examination of the pelvis was performed including evaluation of the uterus, ovaries, adnexal regions, and pelvic cul-de-sac. COMPARISON:  CT scan of April 30, 2018. FINDINGS: Uterus  Measurements: 2.1 x 1.8 x 1.1 cm = volume: 2 mL. No fibroids or other mass visualized. Endometrium Thickness: 2 mm which is within normal limits. No focal abnormality visualized. Right ovary Not visualized. Left ovary Not visualized. Other findings:  No abnormal free fluid. IMPRESSION: Ovaries are not visualized. No definite adnexal mass is noted. Uterine atrophy is noted consistent with patient's age. No definite abnormality seen in the pelvis. Electronically Signed   By: Marijo Conception, M.D.   On: 05/01/2018 11:59   Ct Abdomen Pelvis W Contrast  Result Date: 04/30/2018 CLINICAL DATA:  Abdominal pain since last week. Gastroenteritis or colitis is suspected. EXAM: CT ABDOMEN AND PELVIS WITH CONTRAST TECHNIQUE:  Multidetector CT imaging of the abdomen and pelvis was performed using the standard protocol following bolus administration of intravenous contrast. CONTRAST:  64mL OMNIPAQUE IOHEXOL 300 MG/ML  SOLN COMPARISON:  Chest CT 12/11/2006 FINDINGS: Lower chest: Redemonstration of marked cardiomegaly with biatrial enlargement. No pericardial effusion or thickening. Aneurysmal dilatation of the included ascending thoracic aorta to 4.3 cm, slightly increased from 4 cm previously. No dissection is noted. Contrast is seen within the included distal esophagus possibly from reflux or delayed emptying. Median sternotomy sutures are in place. Small to moderate right effusion is noted. Subpleural areas of fibrosis are identified at each base with mild peribronchial thickening to the right lower lobe. Hepatobiliary: New since 2008 are ill-defined hypodense masses that do not appear to represent simple cysts within the liver. Numerous gallstones are identified within the gallbladder without mural thickening. No biliary dilatation is identified. Pancreas: The distal body and tail of the pancreas are atrophic. Cystic masses of the body and tail of the pancreas are identified the largest in the body measuring 2 x 3.4 x 1.9 cm,  series 2/34 and coronal image 33. A smaller cystic lesion of the tail of the pancreas is identified measuring 1.3 x 0.7 x 1 cm. These could represent pseudocysts versus cystic neoplasms of the pancreas among some possibilities. Spleen: Normal size spleen. Adrenals/Urinary Tract: Normal bilateral adrenal glands. Mild cortical thinning of both kidneys. Symmetric pyelograms on repeat delayed imaging through the kidneys. No hydroureteronephrosis. The urinary bladder is unremarkable for the degree of distention. Stomach/Bowel: The stomach is distended with oral contrast. Normal small bowel rotation is identified. Mid to distal small bowel loops are distended with fluid possibly secondary what appears to be an early or partial large bowel obstruction and potentially an incompetent ileocecal valve. Dilatation of the cecum to 6.8 cm is identified and there is diffuse fluid-filled distention of large bowel noted to a transition point at the splenic flexure. Short segmental annular constricting abnormality with mural thickening is suggested, series 2/35 through 41 and series 7/12. Colonic neoplasm or short segmental focal colitis contributing to large bowel dilatation is raised. Sigmoid diverticulosis without acute diverticulitis is identified. Vascular/Lymphatic: Moderate aortoiliac atherosclerosis with ectasia of the abdominal aorta. Patent branch vessels. Patent splenic and portal veins. No abdominopelvic adenopathy. No inguinal or pelvic sidewall adenopathy. Reproductive: Unremarkable Other: Mild soft tissue anasarca. Small amount ascites about the liver. Musculoskeletal: Advanced osteoarthritic change of both hips bone-on-bone apposition. Thoracolumbar spondylosis. No aggressive osseous lesions are identified. IMPRESSION: 1. Cystic masses of the body and tail of the pancreas measuring 2 x 3.4 x 1.9 cm and 1.3 x 0.7 x 1 cm. These could represent pseudocysts versus cystic neoplasms of the pancreas among some possibilities.  Pancreatic MRI may help for further assessment. 2. Short segmental annular constricting abnormality of the colon at the splenic flexure suspicious for colonic neoplasm or short segmental colitis contributing to large bowel dilatation. Ill-defined hypodense lesions of the liver are new since prior chest CT from 2008. Findings are concerning for hepatic metastasis given their non simple cystic appearance. Mid to distal small bowel loops are distended with fluid may be secondary to incompetence of the ileocecal valve and from the early partial or large bowel obstruction. 3. Uncomplicated cholelithiasis. 4. Aortoiliac atherosclerosis with ectasia of the abdominal aorta. 5. Mild soft tissue anasarca. 6. Advanced osteoarthritic change of both hips. 7. Aneurysmal dilatation of the ascending thoracic aorta to 4.3 cm. Recommend annual imaging followup by CTA or MRA. This recommendation follows 2010 ACCF/AHA/AATS/ACR/ASA/SCA/SCAI/SIR/STS/SVM  Guidelines for the Diagnosis and Management of Patients with Thoracic Aortic Disease. 2010; 121: W098-J191. 8. Stable marked cardiomegaly. These results were called by telephone at the time of interpretation on 04/30/2018 at 8:33 pm to Dr. Arta Silence , who verbally acknowledged these results. Electronically Signed   By: Ashley Royalty M.D.   On: 04/30/2018 20:33   Dg Abd Portable 2v  Result Date: 05/02/2018 CLINICAL DATA:  Small-bowel obstruction.  Abdominal pain. EXAM: PORTABLE ABDOMEN - 2 VIEW COMPARISON:  KUB 05/01/2018.  CT abdomen 04/30/2018. FINDINGS: Distended loops of colon and small bowel again noted. Again colonic obstruction could present this fashion. Reference is made to prior CT report of 04/30/2018. Previously administered oral contrast remains in the colon. IV contrast noted in the bladder. No free air. Degenerative changes scoliosis lumbar spine. Degenerative changes both hips. Diffuse osteopenia. Aortoiliac and peripheral vascular calcification. Prior median  sternotomy. Bibasilar atelectasis/infiltrates. IMPRESSION: 1. Persistent distended loops of colon and small bowel again noted. Again colonic obstruction could present in this fashion. Reference is made to prior CT report of 04/30/2018. No significant change noted. 2.  Bibasilar atelectasis/infiltrates. Electronically Signed   By: Marcello Moores  Register   On: 05/02/2018 07:36   Dg Abd Portable 2v  Result Date: 05/01/2018 CLINICAL DATA:  Follow-up small bowel obstruction EXAM: PORTABLE ABDOMEN - 2 VIEW COMPARISON:  CT from yesterday FINDINGS: Continued small and large bowel dilatation related to a left colonic transition point by prior CT. Cardiomegaly and small to moderate right pleural effusion. IMPRESSION: 1. Unchanged small and large bowel dilatation related to a left colonic transitioned by prior CT. 2. Cardiomegaly and small to moderate right pleural effusion. Electronically Signed   By: Monte Fantasia M.D.   On: 05/01/2018 07:34    PERFORMANCE STATUS (ECOG) : 4 - Bedbound  Review of Systems As noted above. Otherwise, a complete review of systems is negative.  Physical Exam General: thin, frail Cardiovascular: regular rate and rhythm Pulmonary: clear ant fields Abdomen: distended, firm, tender Extremities: no edema, no joint deformities Skin: no rashes Neurological: Weakness but otherwise nonfocal  IMPRESSION: Patient alert. Complains of pain and is moaning some. Recently received dose of morphine. Will give an extra dose to ensure her comfort.   No family currently present. Case discussed with nurse. Patient pending discharge to the [facility] when a bed is available.   PLAN: Com[facility] Care Morphine prn for pain Hospice Home when a bed is available  Time Total: 20 minutes  Visit consisted of counseling and education dealing with the complex and emotionally intense issues of symptom management and palliative care in the setting of serious and potentially life-threatening  illness.Greater than 50%  of this time was spent counseling and coordinating care related to the above assessment and plan.  Signed by: Altha Harm, PhD, NP-C [phone] (Work Cell)

## 2018-05-03 NOTE — Discharge Summary (Signed)
[facility] [place] NAME: Kathy Mccall    MR#:  [id number]  DATE OF BIRTH:  1923-06-25  DATE OF ADMISSION:  04/30/2018   ADMITTING PHYSICIAN: Lance Coon, MD  DATE OF DISCHARGE: 05/03/2018  PRIMARY CARE PHYSICIAN: Jerrol Banana., MD   ADMISSION DIAGNOSIS:  Hyponatremia [E87.1] Generalized abdominal pain [R10.84] Weight loss [R63.4] Large bowel obstruction ([place]) [K56.609] Lower abdominal pain [R10.30] Diarrhea, unspecified type [R19.7] DISCHARGE DIAGNOSIS:  Principal Problem:   Bowel obstruction ([facility]) Active Problems:   Hyperlipidemia   CAD, NATIVE VESSEL   Heart valve replaced   Essential (primary) hypertension   Hypothyroidism   Palliative care encounter   Severe protein-calorie malnutrition ([place])   Colonic mass  SECONDARY DIAGNOSIS:   Past Medical History:  Diagnosis Date  . Arthritis   . Atrial fibrillation ([place])   . Congestive heart failure ([place])   . Hyperlipidemia   . Hypertension   . Rheumatic fever   . S/P aortic valve replacement    [facility] [place] COURSE:  1. Bowel obstruction ([place]) -partial obstruction seen on CT imaging, suspicion for colonic neoplasm. Abdominal pains appear improved this morning but still has some pains. Patient evaluated by surgeon .  Patient has near obstructing splenic lesion of the colon with mets to the liver concerning for malignancy.  No tissue diagnosis yet.  Patient also has evidence of cystic mass in the body and tail of pancreas which could represent pseudocyst versus cystic neoplasm of the pancreas.  Discussed MRI for further evaluation with granddaughter who is healthcare power of attorney.  They do not wish to be aggressive at this time given patient's advanced age and comorbidities.  Patient does have significant weight loss and severely malnourished surgeon discussed extensively with patient's granddaughter who is also healthcare power of attorney.  Including risks  and benefits of surgery due to patient's advanced age.  They do not want any major intervention at this time . Patient already seen by palliative care team.  Arrangements are being made to have patient transferred to hospice today.   2.CAD, stable -continue home medications Heart valve replaced -aortic valve replacement,  Coumadin currently on hold due to supratherapeutic INR of 5.38.    3.Essential (primary) hypertension -continue home medications  4.Hypothyroidism -she is on home dose thyroid replacement. TSH level normal  5.  Hyponatremia  Likely due to dehydration.  Sodium level improved to 131 with gentle IV fluid hydration. Poor prognosis. DISCHARGE CONDITIONS:  Discharge to hospice home today. CONSULTS OBTAINED:  Treatment Team:  Cammie Sickle, MD DRUG ALLERGIES:   Allergies  Allergen Reactions  . Sulfa Antibiotics   . Sulfonamide Derivatives    DISCHARGE MEDICATIONS:   Allergies as of 05/03/2018      Reactions   Sulfa Antibiotics    Sulfonamide Derivatives       Medication List    STOP taking these medications   ALIGN PO   DAILY MULTIPLE VITAMINS tablet   furosemide 20 MG tablet Commonly known as:  LASIX   levothyroxine 50 MCG tablet Commonly known as:  SYNTHROID, LEVOTHROID   lisinopril 10 MG tablet Commonly known as:  PRINIVIL,ZESTRIL   omeprazole 20 MG capsule Commonly known as:  PRILOSEC   OSCAL 500/200 D-3 500-200 MG-UNIT tablet Generic drug:  calcium-vitamin D   PARoxetine 10 MG tablet Commonly known as:  PAXIL   potassium chloride SA 20 MEQ tablet Commonly known as:  K-DUR,KLOR-CON   pravastatin  20 MG tablet Commonly known as:  PRAVACHOL   warfarin 3 MG tablet Commonly known as:  COUMADIN     TAKE these medications   acetaminophen 325 MG tablet Commonly known as:  TYLENOL Take 2 tablets (650 mg total) by mouth every 6 (six) hours as needed for mild pain (or Fever >/= 101).   acetaminophen 650 MG  suppository Commonly known as:  TYLENOL Place 1 suppository (650 mg total) rectally every 6 (six) hours as needed for mild pain (or Fever >/= 101).   latanoprost 0.005 % ophthalmic solution Commonly known as:  XALATAN Place 1 drop into both eyes at bedtime.   morphine 2 MG/ML injection Inject 1 mL (2 mg total) into the vein every 4 (four) hours as needed.   ondansetron 4 MG tablet Commonly known as:  ZOFRAN Take 1 tablet (4 mg total) by mouth every 6 (six) hours as needed for nausea.   ondansetron 4 MG/2ML Soln injection Commonly known as:  ZOFRAN Inject 2 mLs (4 mg total) into the vein every 6 (six) hours as needed for nausea.   TIMOPTIC 0.5 % ophthalmic solution Generic drug:  timolol Place 1 drop into both eyes daily.        DISCHARGE INSTRUCTIONS:  See AVS.  If you experience worsening of your admission symptoms, develop shortness of breath, life threatening emergency, suicidal or homicidal thoughts you must seek medical attention immediately by calling 911 or calling your MD immediately  if symptoms less severe.  You Must read complete instructions/literature along with all the possible adverse reactions/side effects for all the Medicines you take and that have been prescribed to you. Take any new Medicines after you have completely understood and accpet all the possible adverse reactions/side effects.   Please note  You were cared for by a hospitalist during your hospital stay. If you have any questions about your discharge medications or the care you received while you were in the hospital after you are discharged, you can call the unit and asked to speak with the hospitalist on call if the hospitalist that took care of you is not available. Once you are discharged, your primary care physician will handle any further medical issues. Please note that NO REFILLS for any discharge medications will be authorized once you are discharged, as it is imperative that you return to  your primary care physician (or establish a relationship with a primary care physician if you do not have one) for your aftercare needs so that they can reassess your need for medications and monitor your lab values.    On the day of Discharge:  VITAL SIGNS:  Blood pressure (!) 80/48, pulse 89, temperature 97.6 F (36.4 C), temperature source Oral, resp. rate 16, height 5\' 2"  (1.575 m), weight 41.1 kg, SpO2 92 %.  DATA REVIEW:   CBC Recent Labs  Lab 05/02/18 0446  WBC 9.2  HGB 9.7*  HCT 29.3*  PLT 195    Chemistries  Recent Labs  Lab 05/02/18 0446  NA 131*  K 3.8  CL 102  CO2 17*  GLUCOSE 78  BUN 29*  CREATININE 1.03*  CALCIUM 7.8*  MG 1.8  AST 27  ALT 16  ALKPHOS 73  BILITOT 2.4*     Microbiology Results  No results found for this or any previous visit.  RADIOLOGY:  No results found.   Management plans discussed with the patient, family and they are in agreement.  CODE STATUS: DNR   TOTAL TIME TAKING  CARE OF THIS PATIENT: 32 minutes.    Demetrios Loll M.D on 05/03/2018 at 9:13 AM  Between 7am to 6pm - Pager - [phone]  After 6pm go to [email or web address] - password [email or web address]  [facility]  Office  [phone]  CC: Primary care physician; Jerrol Banana., MD   Note: This dictation was prepared with Dragon dictation along with smaller phrase technology. Any transcriptional errors that result from this process are unintentional.

## 2018-05-03 NOTE — Progress Notes (Signed)
Follow up visit made to new hospice home referral. Patient seen lying in bed, alert, appeared uncomfortable. Morphine 2 mg IV given at 9:15. Emotional support provided, patient aware of discharge plan. EMS notified for 11 pick up. Discharge summary faxed to referral. Report given to the hospice home. Hospital care team updated. Writer notified patient's granddaughter Jeral Fruit about planned transport. Thank you. Flo Shanks RN, BSN, [place] [facility], hospital Liaison [phone]

## 2018-05-03 NOTE — Progress Notes (Signed)
Kathy Mccall   DOB:[birth year]   OX#:[id number]    Subjective: Patient complaining o mild to moderate pain.[name]  No family by the bedside.  As patient is drowsy unable to get more information.  ROS: Unable to assess given patient's mentation.  Objective:  Vitals:   05/02/18 2235 05/03/18 0634  BP:  (!) 80/48  Pulse:  89  Resp:  16  Temp:  97.6 F (36.4 C)  SpO2: 92% 92%     Intake/Output Summary (Last 24 hours) at 05/03/2018 2243 Last data filed at 05/03/2018 0915 Gross per 24 hour  Intake -  Output 0 ml  Net 0 ml    Physical Exam  Constitutional:  Cachectic appearing Caucasian female patient.  She is resting in bed.  Complaining of pain.  Drowsy.  HENT:  Head: Normocephalic and atraumatic.  Mouth/Throat: Oropharynx is clear and moist. No oropharyngeal exudate.  Eyes: Pupils are equal, round, and reactive to light.  Neck: Normal range of motion. Neck supple.  Cardiovascular: Normal rate and regular rhythm.  Pulmonary/Chest: No respiratory distress. She has no wheezes.  Decreased breath sounds at the bases.  Abdominal: Soft. Bowel sounds are normal. She exhibits no distension and no mass. There is no abdominal tenderness. There is no rebound and no guarding.  Abdominal distention noted.  Mild tenderness noted.   Musculoskeletal: Normal range of motion.        General: No tenderness or edema.  Neurological:  Drowsy.  Easily arousable.  Skin: Skin is warm.  Psychiatric: Affect normal.     Labs:  Lab Results  Component Value Date   WBC 9.2 05/02/2018   HGB 9.7 (L) 05/02/2018   HCT 29.3 (L) 05/02/2018   MCV 104.3 (H) 05/02/2018   PLT 195 05/02/2018   NEUTROABS 5.8 04/25/2018    Lab Results  Component Value Date   NA 131 (L) 05/02/2018   K 3.8 05/02/2018   CL 102 05/02/2018   CO2 17 (L) 05/02/2018    Studies:  Dg Abd Portable 2v  Result Date: 05/02/2018 CLINICAL DATA:  Small-bowel obstruction.  Abdominal pain. EXAM: PORTABLE ABDOMEN - 2 VIEW COMPARISON:  [facility]  05/01/2018.  CT abdomen 04/30/2018. FINDINGS: Distended loops of colon and small bowel again noted. Again colonic obstruction could present this fashion. Reference is made to prior CT report of 04/30/2018. Previously administered oral contrast remains in the colon. IV contrast noted in the bladder. No free air. Degenerative changes scoliosis lumbar spine. Degenerative changes both hips. Diffuse osteopenia. Aortoiliac and peripheral vascular calcification. Prior median sternotomy. Bibasilar atelectasis/infiltrates. IMPRESSION: 1. Persistent distended loops of colon and small bowel again noted. Again colonic obstruction could present in this fashion. Reference is made to prior CT report of 04/30/2018. No significant change noted. 2.  Bibasilar atelectasis/infiltrates. Electronically Signed   By: Marcello Moores  Register   On: 05/02/2018 07:36    Colonic mass #85 year old female patient history of mechanical heart valve/A. Fib-on Coumadin; currently admitted to hospital for abdominal bloating/distention-with CT scan meeting concerning for splenic flexure malignancy/colonic obstruction  #Splenic flexure colonic mass-with hypodense lesions in the liver suspicious for malignancy-with evidence of bowel obstruction.  No colonoscopy/biopsy.  #Cystic masses in the pancreas body and tail-pseudocyst versus complex cyst  #Elderly frail/mechanical valve-Coumadin/supratherapeutic INR.  Recommendations: Given patient's advanced age frail condition poor candidate for any surgical intervention.  Patient is currently obstructed with a colonic mass-with abdominal distention/pain.  Given absence of definitive treatment intervention-agree with hospice recommendations.  Patient and family on board with hospice.  Awaiting hospice home discharge today.  Discussed with Dr. Bridgett Larsson.  Also discussed with palliative care provider Josh Borders.  Expected life expectancy in the order of less than 3 weeks.     Cammie Sickle,  MD 05/03/2018  10:43 PM

## 2018-05-03 NOTE — Progress Notes (Signed)
Patient being discharged to [facility] report given to [facility] by Flo Shanks, RN. Patient IV not removed per hospice nurse request removed. Patient will be transported by EMS. All patient belongings gathered prior to leaving.

## 2018-05-08 ENCOUNTER — Ambulatory Visit: Payer: Self-pay | Admitting: Family Medicine

## 2018-05-09 ENCOUNTER — Ambulatory Visit: Payer: Self-pay | Admitting: Family Medicine

## 2018-05-13 ENCOUNTER — Ambulatory Visit: Payer: Self-pay | Admitting: Gastroenterology

## 2018-05-25 DEATH — deceased

## 2019-11-01 IMAGING — CR DG ABDOMEN 1V
1 series · 1 of 1 positions shown · non-contrast
Comparison: None.

CLINICAL DATA: Lower abdominal pain, constipation, and unintended
weight loss.

EXAM:
ABDOMEN - 1 VIEW

[abdomen kub]
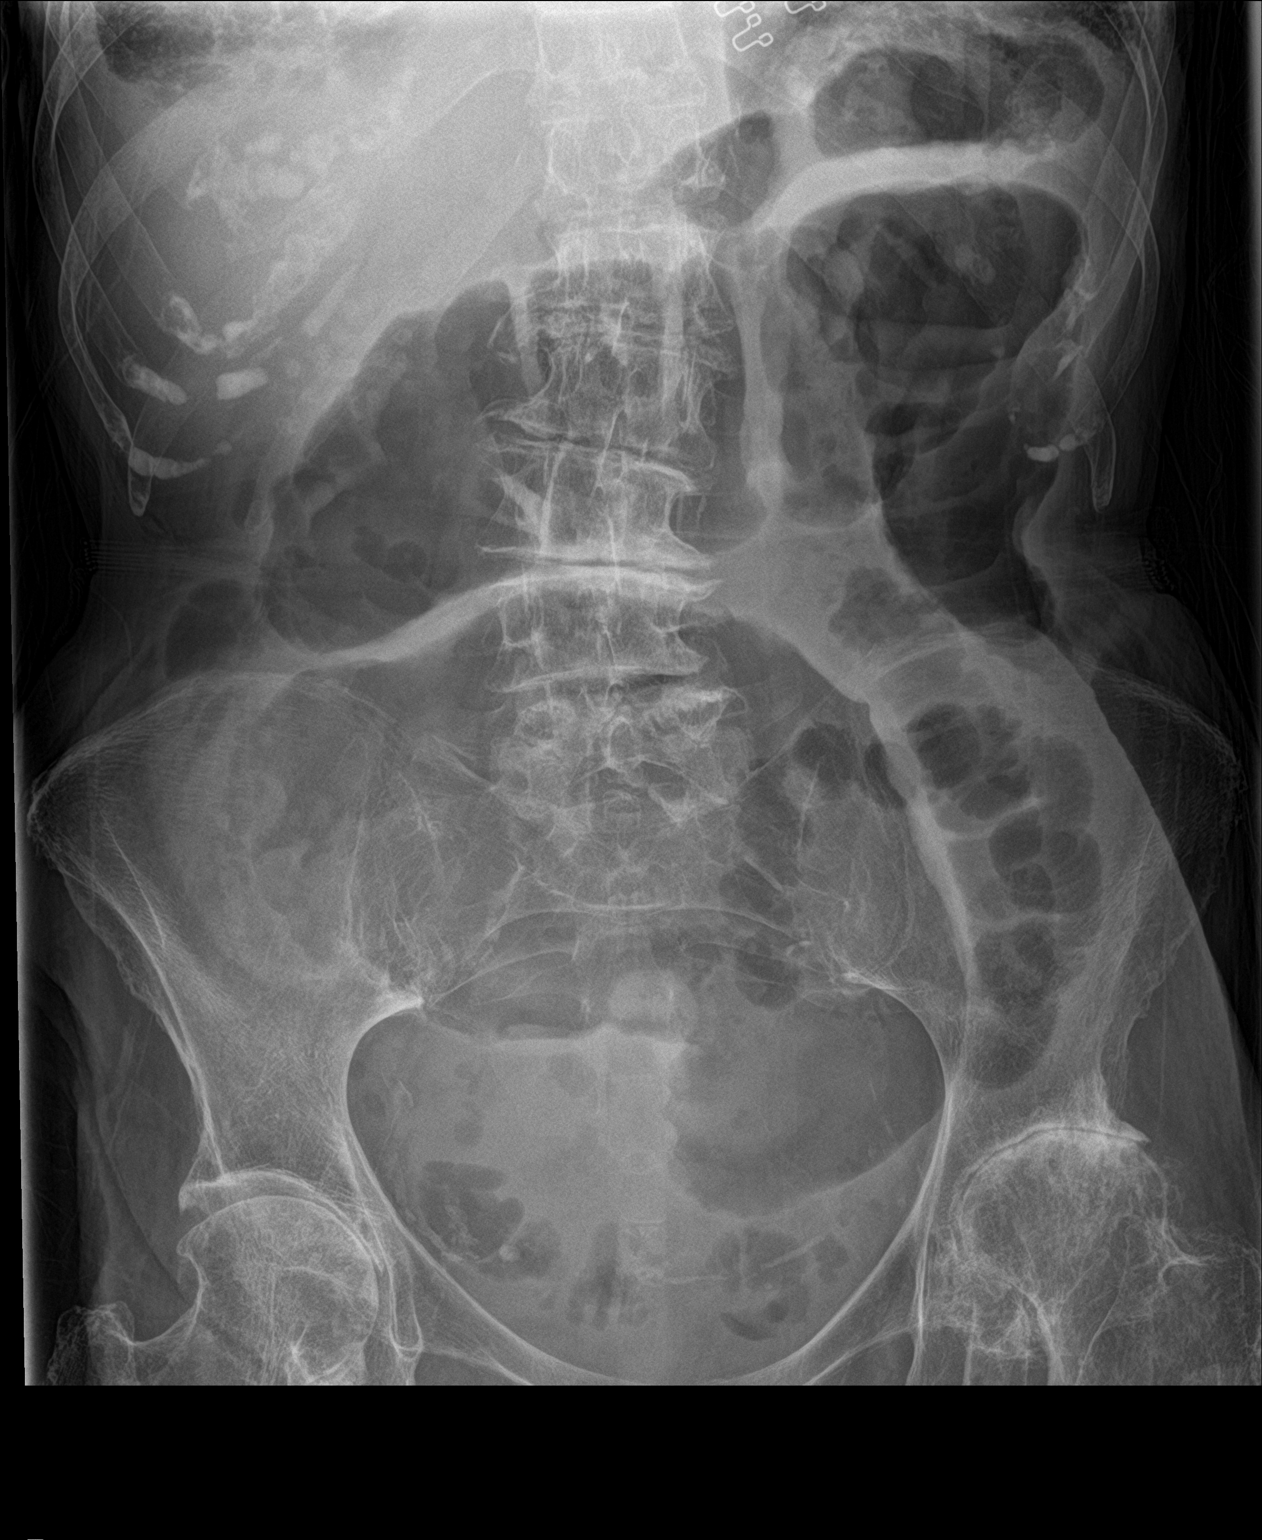

[1 of 1 positions shown; findings below may reference images not displayed]

FINDINGS: Gaseous colonic distention, most prominent in the cecum to 13 cm.
Air-filled normal caliber small bowel in the left abdomen without
obstruction. No evidence of free air. No significant formed stool in
the colon. No definite radiopaque calculi, costochondral cartilage
calcifications in the upper abdomen partially obscure evaluation.
Scoliotic curvature and degenerative change of the spine. Advanced
degenerative change of the left hip.
IMPRESSION: 1. Gaseous colonic distension, greatest in the right lower quadrant
likely involving the cecum. No evidence of bowel obstruction.
Consider CT for further evaluation.
2. No evidence of bowel obstruction or free air.

## 2019-11-03 IMAGING — DX DG ABD PORTABLE 2V
2 series · 2 of 2 positions shown · non-contrast
Comparison: CT from yesterday

CLINICAL DATA: Follow-up small bowel obstruction

EXAM:
PORTABLE ABDOMEN - 2 VIEW

[abdomen erect]
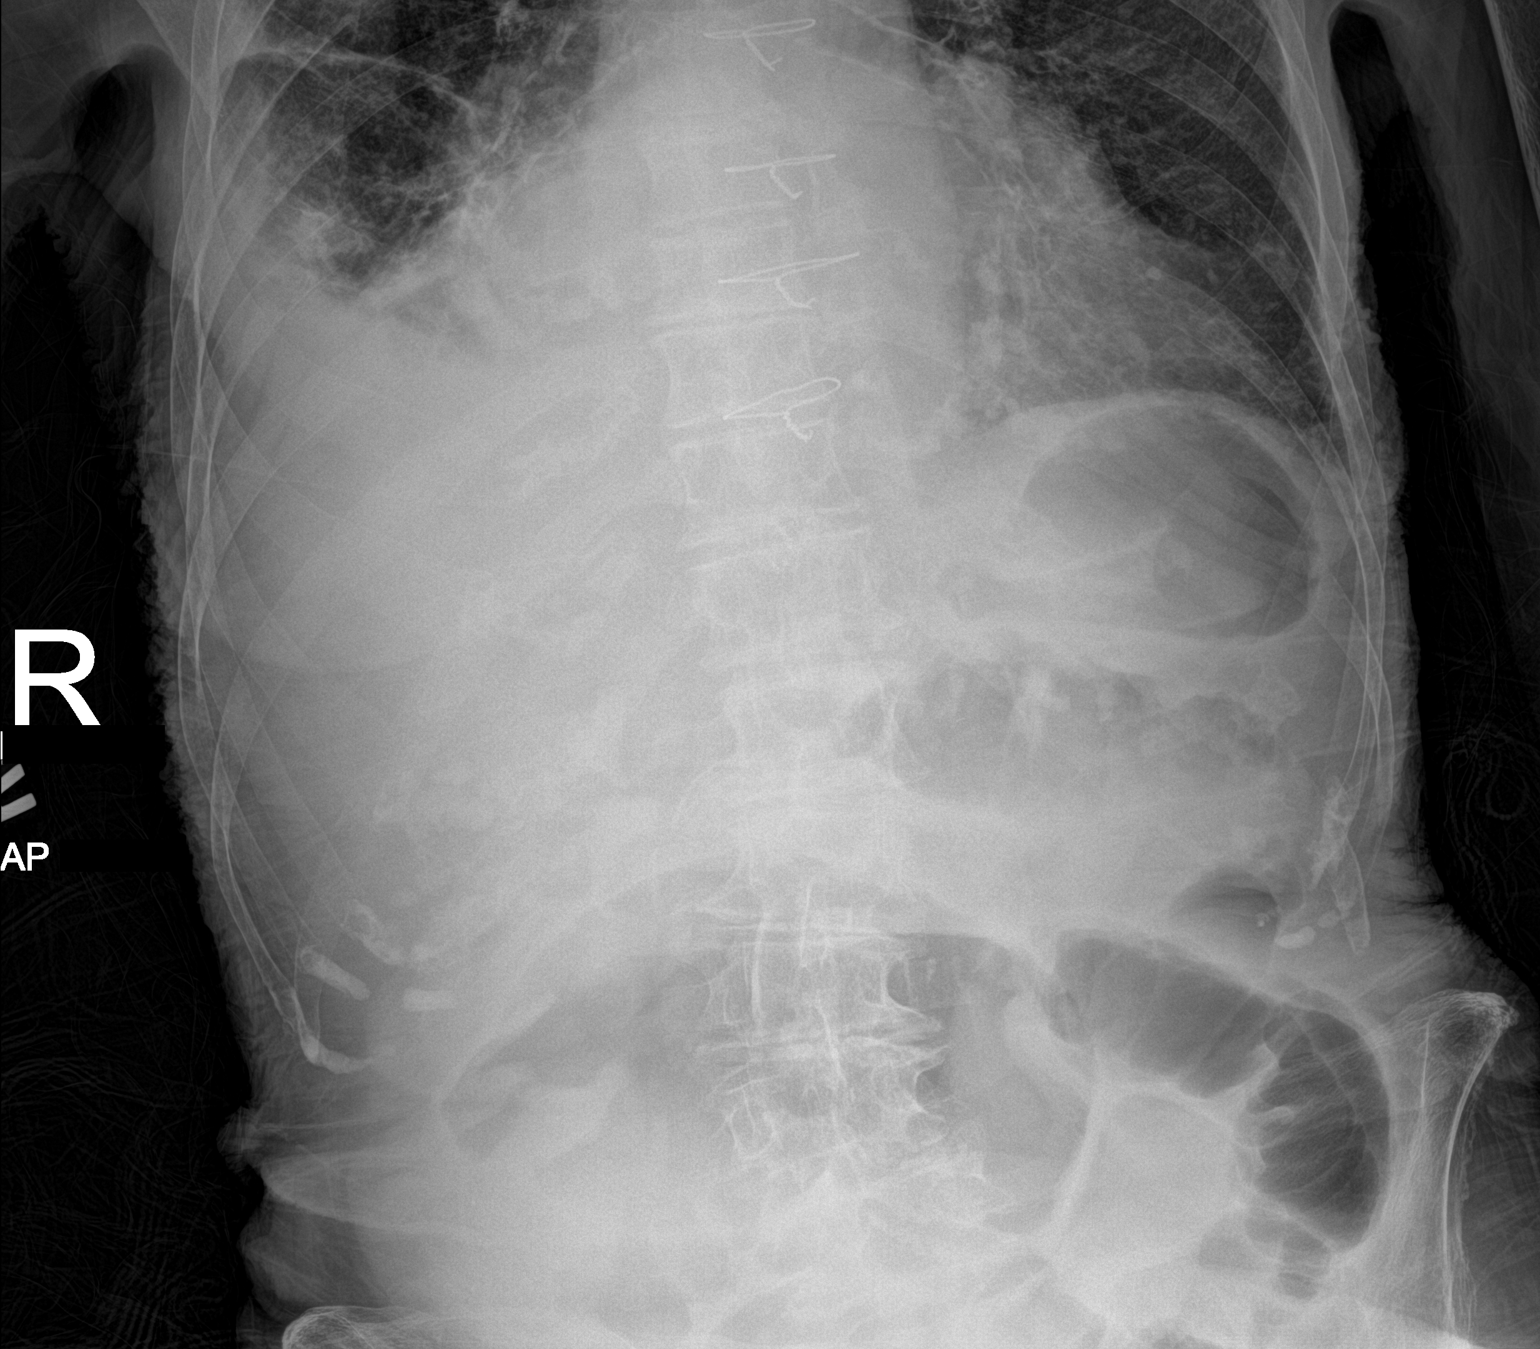

[abdomen supine]
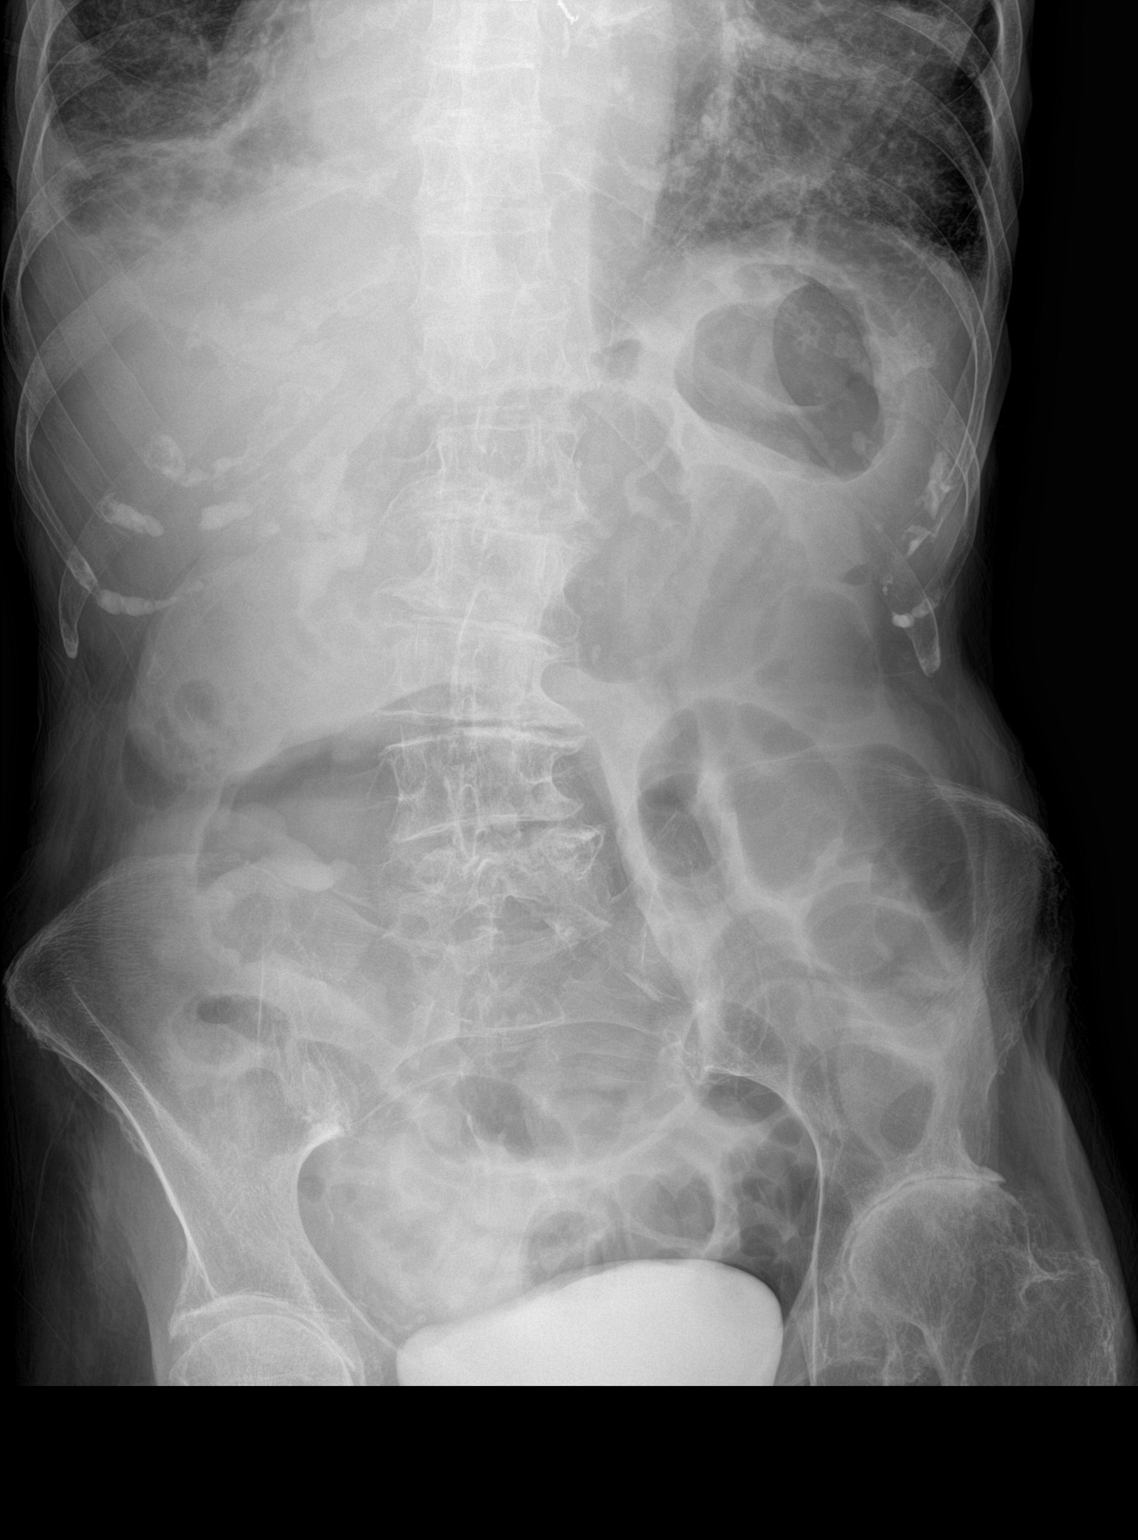

[2 of 2 positions shown; findings below may reference images not displayed]

FINDINGS: Continued small and large bowel dilatation related to a left colonic
transition point by prior CT. Cardiomegaly and small to moderate
right pleural effusion.
IMPRESSION: 1. Unchanged small and large bowel dilatation related to a left
colonic transitioned by prior CT.
2. Cardiomegaly and small to moderate right pleural effusion.

## 2019-11-03 IMAGING — US US PELVIS COMPLETE
1 series · 14 of 25 positions shown · non-contrast
Comparison: CT scan of April 30, 2018.

CLINICAL DATA: Lower abdominal pain, weight loss.

EXAM:
TRANSABDOMINAL ULTRASOUND OF PELVIS
TECHNIQUE: Transabdominal ultrasound examination of the pelvis was performed
including evaluation of the uterus, ovaries, adnexal regions, and
pelvic cul-de-sac.

[Series 1: us pelvis complete · 14 of 46 slices shown]
[im 1/46]
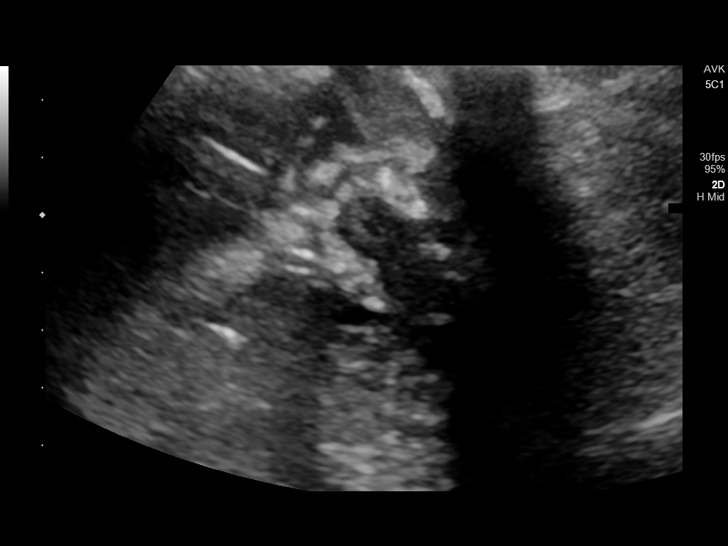
[im 4/46]
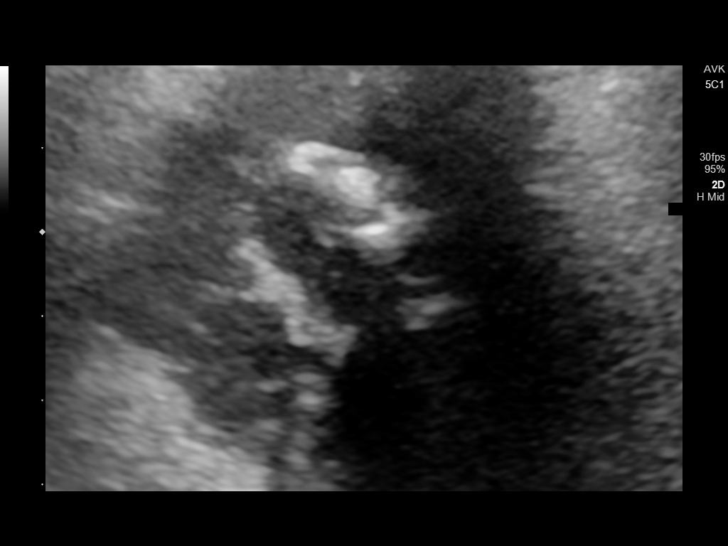
[im 8/46]
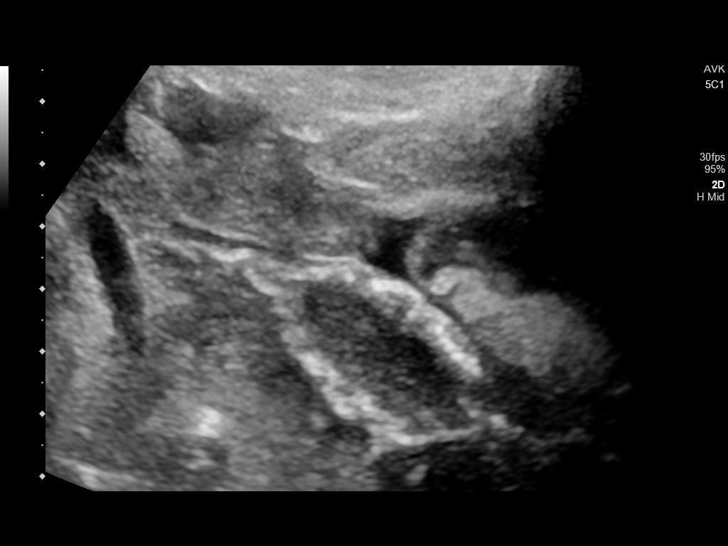
[im 12/46]
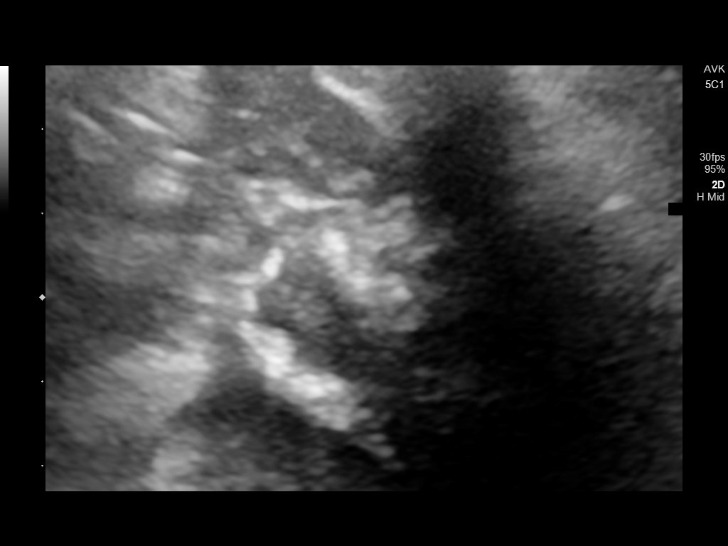
[im 16/46]
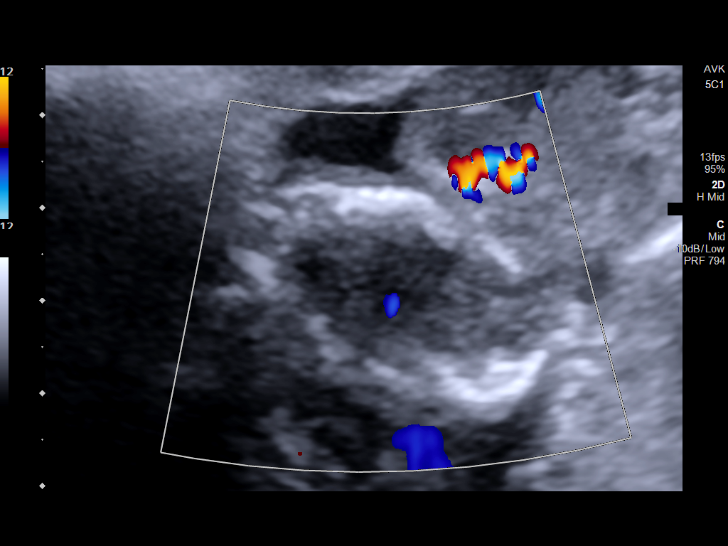
[im 17/46]
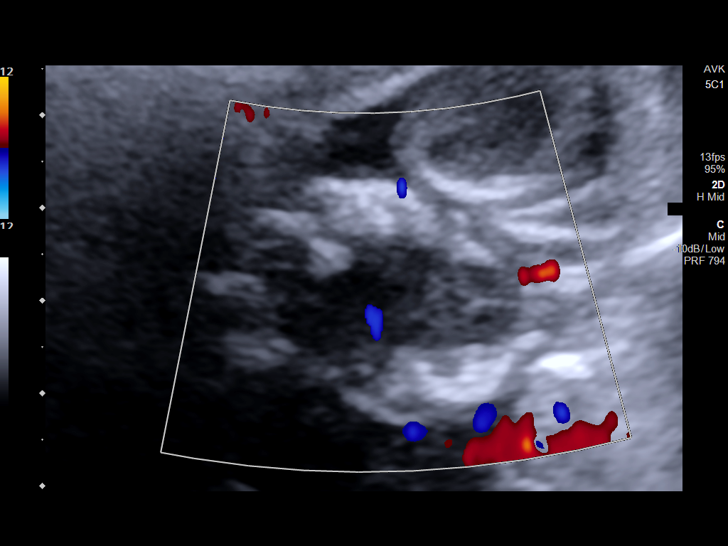
[im 21/46]
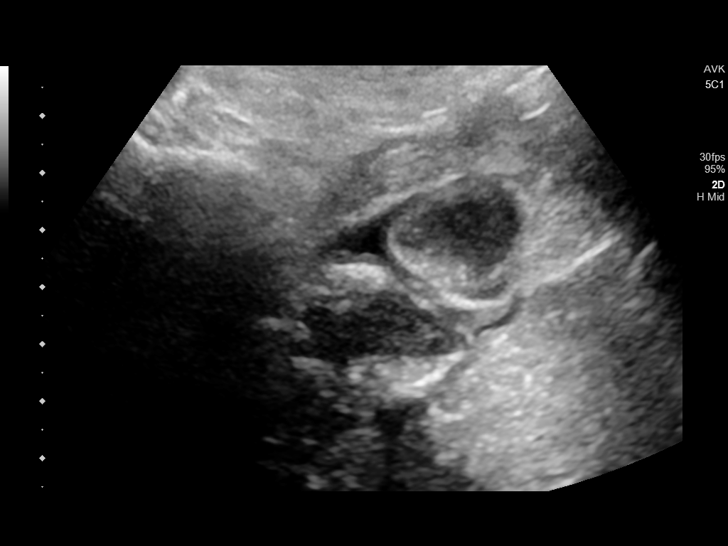
[im 25/46]
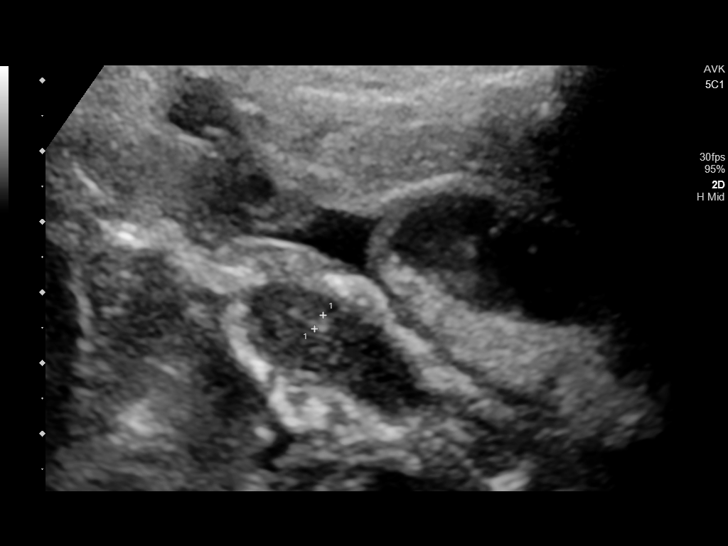
[im 29/46]
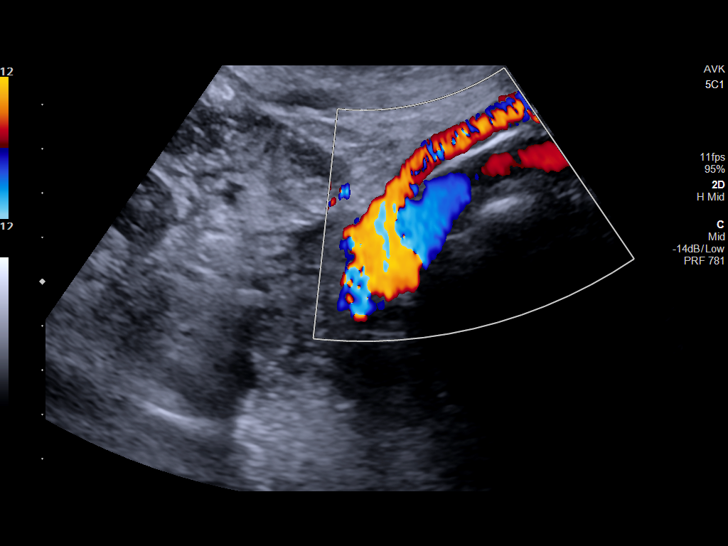
[im 31/46]
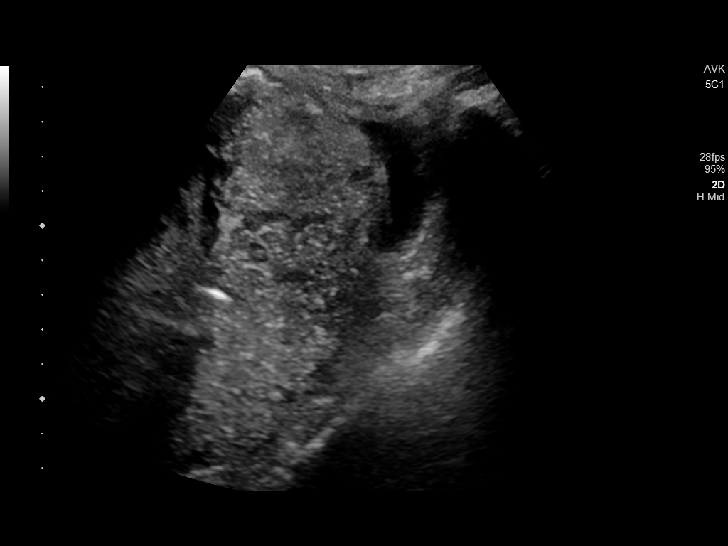
[im 34/46]
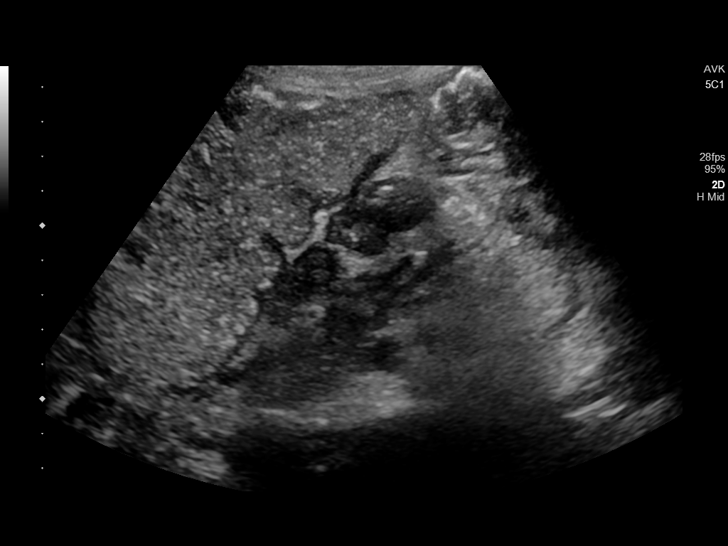
[im 38/46]
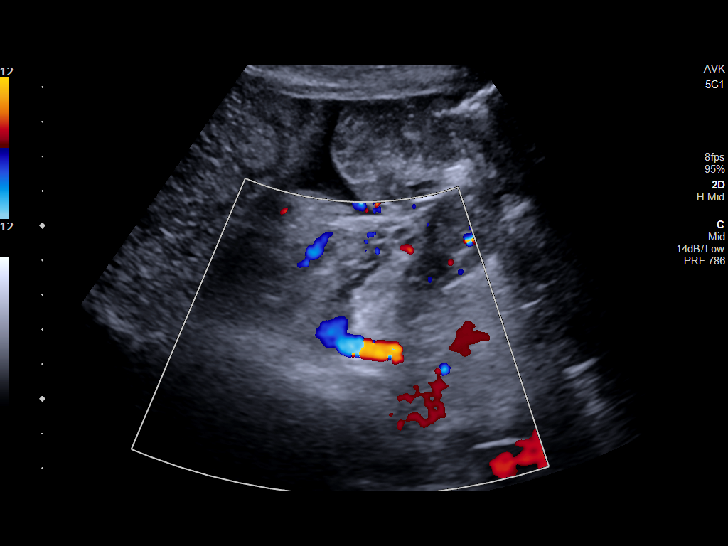
[im 42/46]
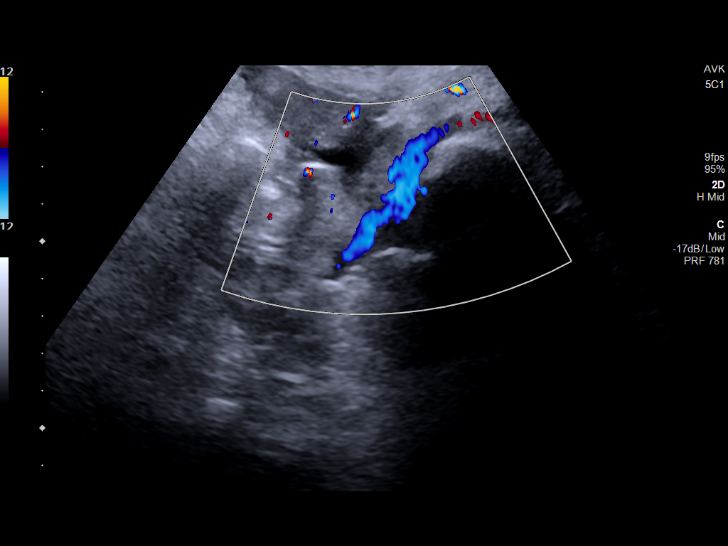
[im 46/46]
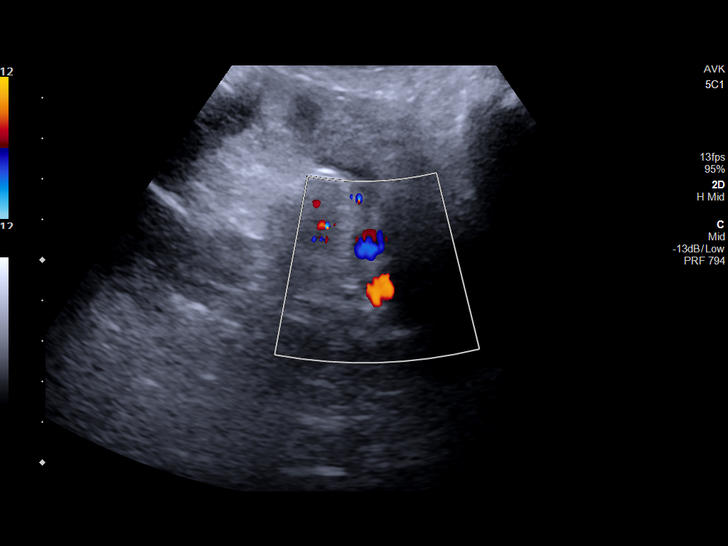

[14 of 25 positions shown; findings below may reference images not displayed]

FINDINGS: Uterus

Measurements: 2.1 x 1.8 x 1.1 cm = volume: 2 mL. No fibroids or
other mass visualized.

Endometrium

Thickness: 2 mm which is within normal limits. No focal abnormality
visualized.

Right ovary

Not visualized.

Left ovary

Not visualized.

Other findings:  No abnormal free fluid.
IMPRESSION: Ovaries are not visualized. No definite adnexal mass is noted.
Uterine atrophy is noted consistent with patient's age. No definite
abnormality seen in the pelvis.

## 2019-11-04 IMAGING — DX DG ABD PORTABLE 2V
2 series · 2 of 2 positions shown · non-contrast
Comparison: [facility] 05/01/2018.  CT abdomen 04/30/2018.

CLINICAL DATA: Small-bowel obstruction.  Abdominal pain.

EXAM:
PORTABLE ABDOMEN - 2 VIEW

[abdomen erect]
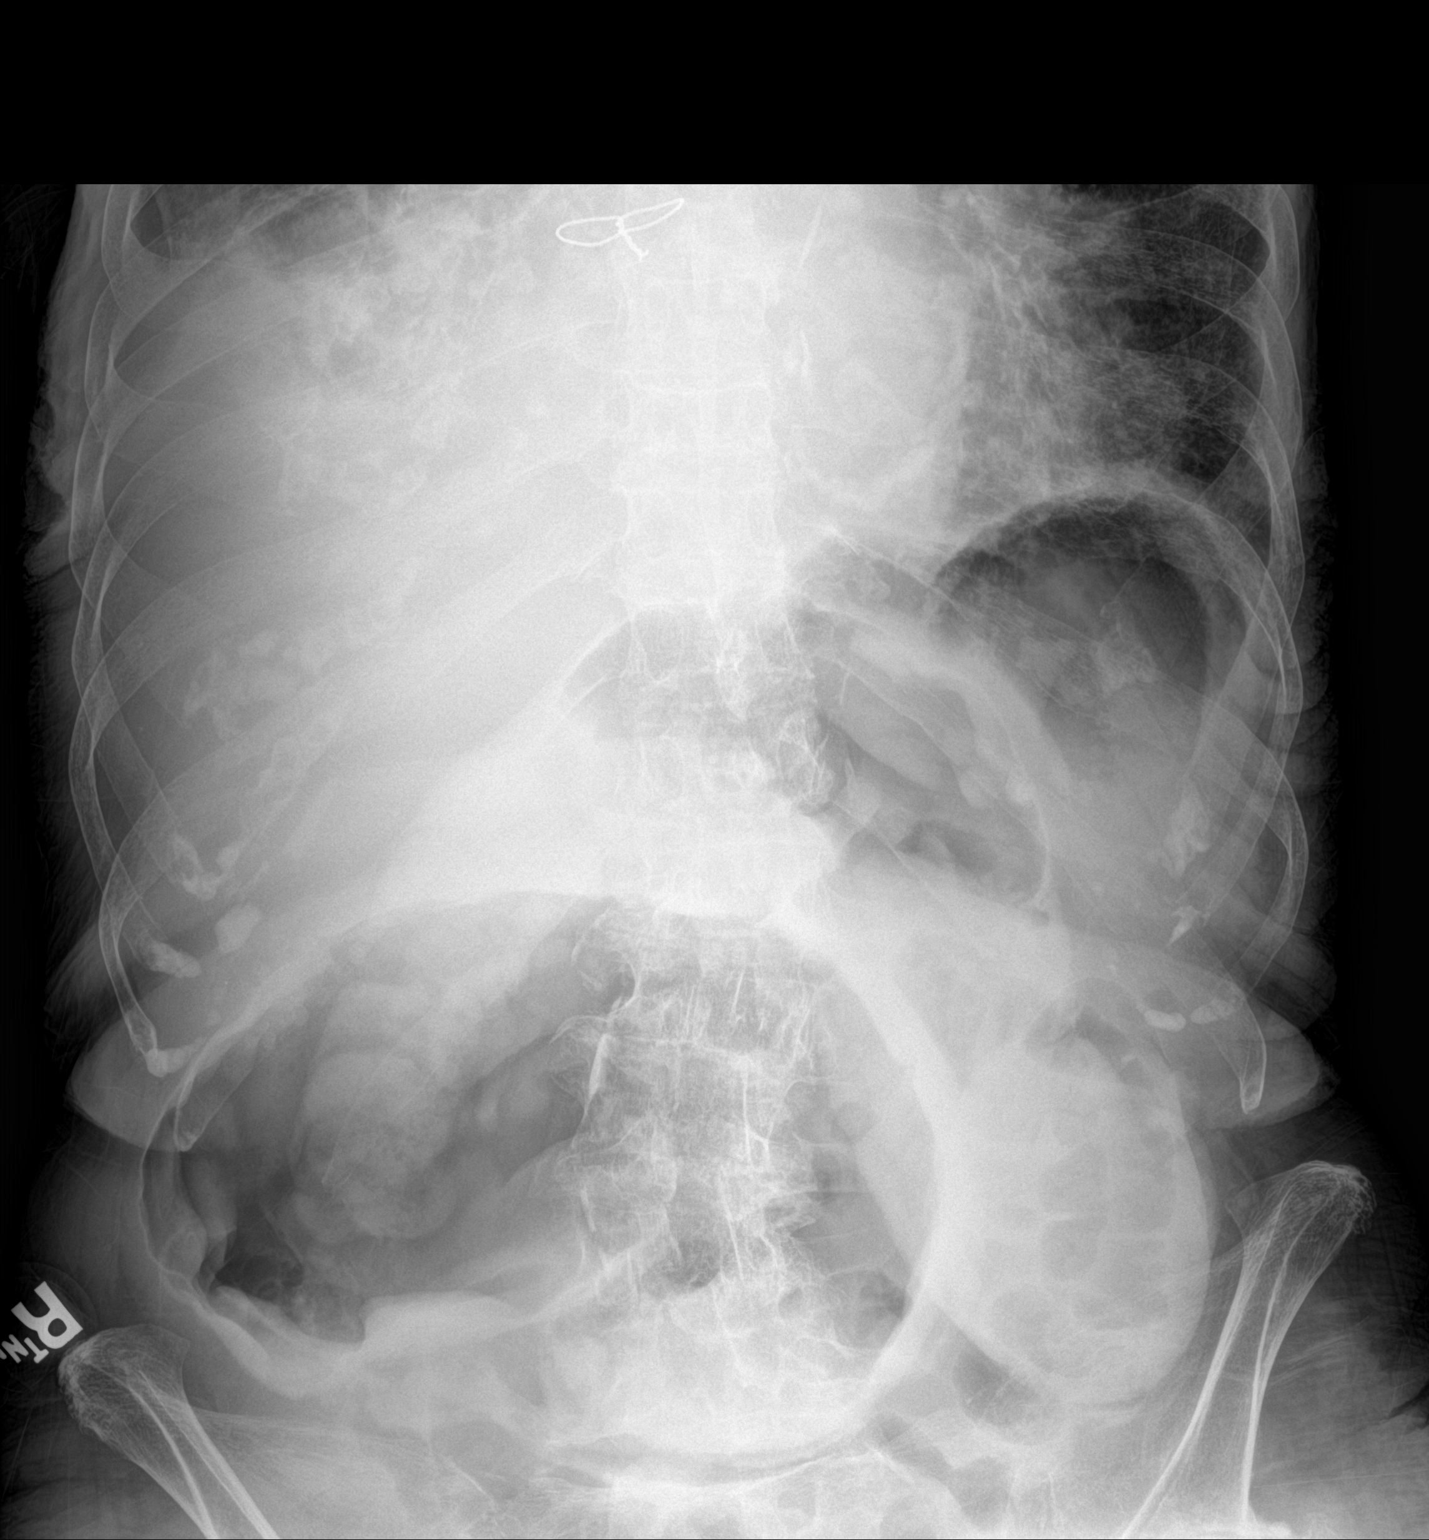

[abdomen supine]
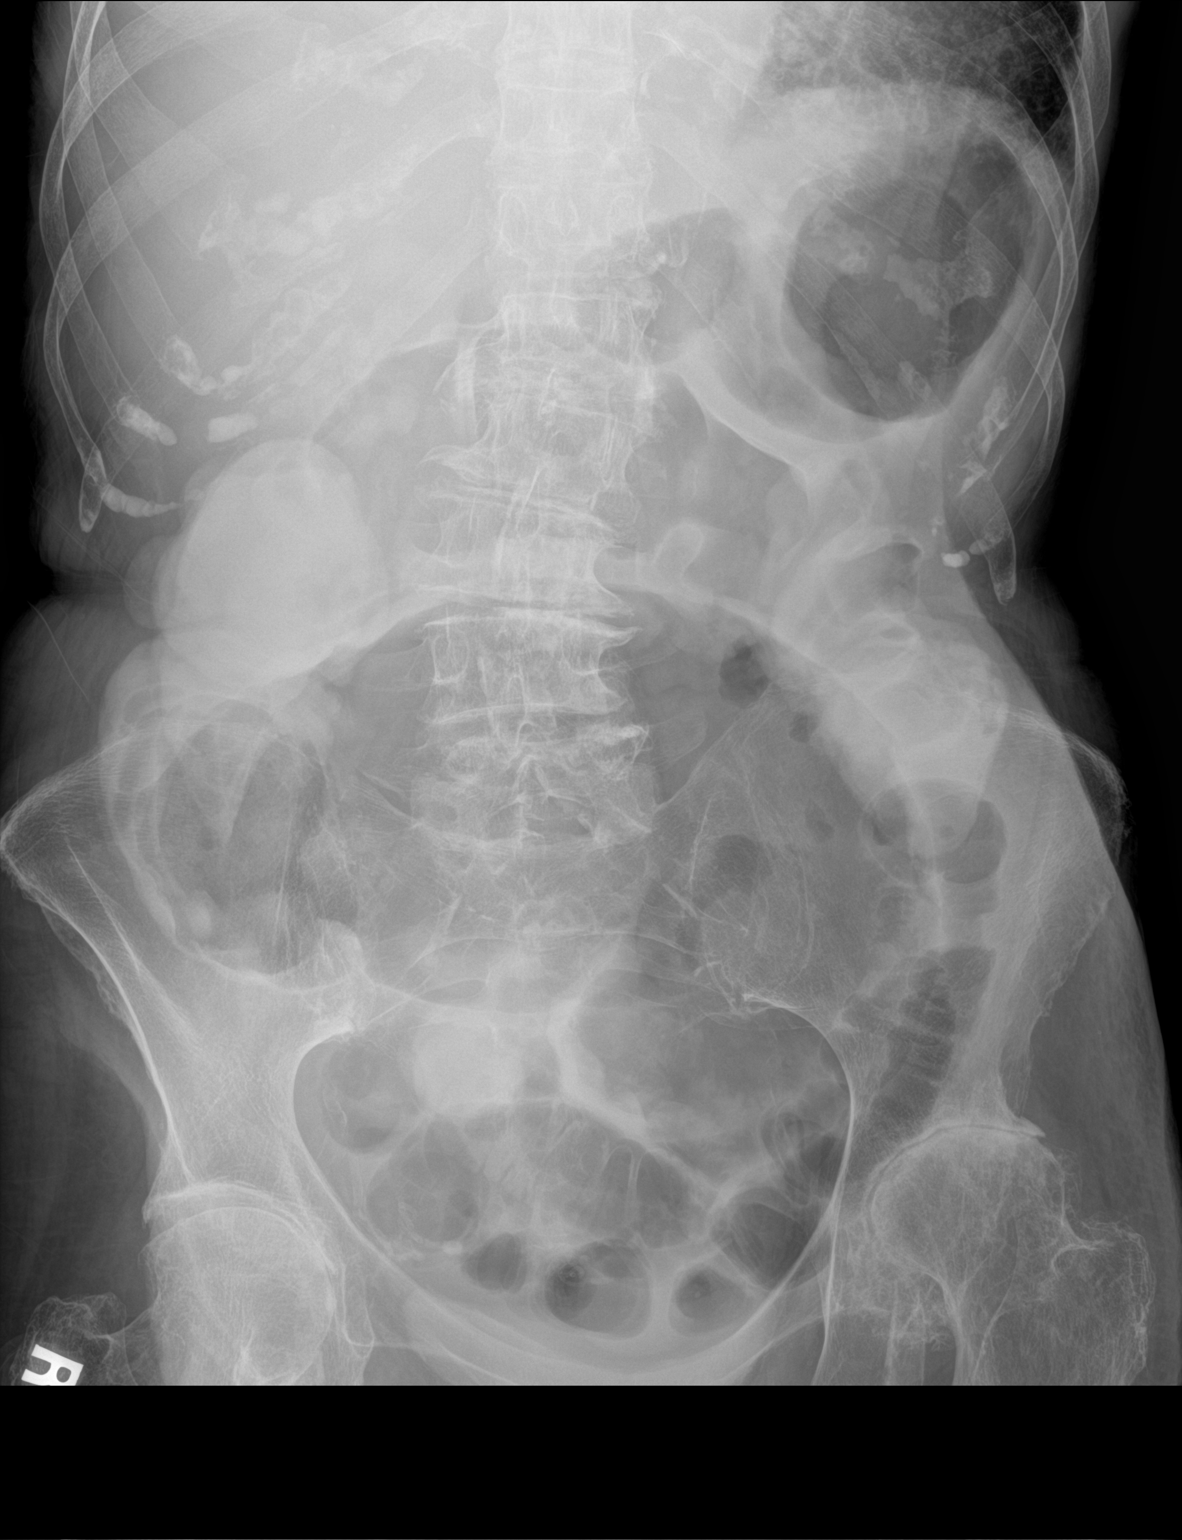

[2 of 2 positions shown; findings below may reference images not displayed]

FINDINGS: Distended loops of colon and small bowel again noted. Again colonic
obstruction could present this fashion. Reference is made to prior
CT report of 04/30/2018. Previously administered oral contrast
remains in the colon. IV contrast noted in the bladder. No free air.
Degenerative changes scoliosis lumbar spine. Degenerative changes
both hips. Diffuse osteopenia. Aortoiliac and peripheral vascular
calcification. Prior median sternotomy. Bibasilar
atelectasis/infiltrates.
IMPRESSION: 1. Persistent distended loops of colon and small bowel again noted.
Again colonic obstruction could present in this fashion. Reference
is made to prior CT report of 04/30/2018. No significant change
noted.

2.  Bibasilar atelectasis/infiltrates.
